# Patient Record
Sex: Male | Born: 1965 | Race: White | Hispanic: No | Marital: Married | Smoking: Never smoker
Health system: Southern US, Community
[De-identification: ages and names within clinical notes are randomized; demographics above are authoritative.]

## PROBLEM LIST (undated history)

## (undated) DIAGNOSIS — G4730 Sleep apnea, unspecified: Secondary | ICD-10-CM

## (undated) DIAGNOSIS — K219 Gastro-esophageal reflux disease without esophagitis: Secondary | ICD-10-CM

## (undated) DIAGNOSIS — N200 Calculus of kidney: Secondary | ICD-10-CM

## (undated) DIAGNOSIS — I10 Essential (primary) hypertension: Secondary | ICD-10-CM

## (undated) DIAGNOSIS — H269 Unspecified cataract: Secondary | ICD-10-CM

## (undated) DIAGNOSIS — I4510 Unspecified right bundle-branch block: Secondary | ICD-10-CM

## (undated) DIAGNOSIS — L905 Scar conditions and fibrosis of skin: Secondary | ICD-10-CM

## (undated) DIAGNOSIS — Z87442 Personal history of urinary calculi: Secondary | ICD-10-CM

## (undated) HISTORY — DX: Unspecified cataract: H26.9

## (undated) HISTORY — DX: Scar conditions and fibrosis of skin: L90.5

## (undated) HISTORY — DX: Unspecified right bundle-branch block: I45.10

## (undated) HISTORY — PX: ELBOW SURGERY: SHX618

## (undated) HISTORY — DX: Sleep apnea, unspecified: G47.30

## (undated) HISTORY — DX: Gastro-esophageal reflux disease without esophagitis: K21.9

## (undated) HISTORY — PX: OTHER SURGICAL HISTORY: SHX169

## (undated) HISTORY — DX: Essential (primary) hypertension: I10

---

## 2001-02-16 ENCOUNTER — Ambulatory Visit (HOSPITAL_BASED_OUTPATIENT_CLINIC_OR_DEPARTMENT_OTHER): Admission: RE | Admit: 2001-02-16 | Discharge: 2001-02-17 | Payer: Self-pay | Admitting: Orthopedic Surgery

## 2001-06-28 ENCOUNTER — Encounter: Payer: Self-pay | Admitting: Emergency Medicine

## 2001-06-28 ENCOUNTER — Inpatient Hospital Stay (HOSPITAL_COMMUNITY): Admission: EM | Admit: 2001-06-28 | Discharge: 2001-06-29 | Payer: Self-pay | Admitting: Internal Medicine

## 2001-06-28 ENCOUNTER — Encounter: Payer: Self-pay | Admitting: Internal Medicine

## 2001-06-29 ENCOUNTER — Encounter: Payer: Self-pay | Admitting: Internal Medicine

## 2001-08-15 ENCOUNTER — Encounter: Payer: Self-pay | Admitting: *Deleted

## 2001-08-15 ENCOUNTER — Inpatient Hospital Stay (HOSPITAL_COMMUNITY): Admission: EM | Admit: 2001-08-15 | Discharge: 2001-08-16 | Payer: Self-pay | Admitting: Emergency Medicine

## 2003-09-12 ENCOUNTER — Encounter: Payer: Self-pay | Admitting: Internal Medicine

## 2003-10-10 ENCOUNTER — Encounter: Payer: Self-pay | Admitting: Internal Medicine

## 2006-12-16 ENCOUNTER — Encounter: Admission: RE | Admit: 2006-12-16 | Discharge: 2006-12-16 | Payer: Self-pay | Admitting: Emergency Medicine

## 2007-08-28 ENCOUNTER — Encounter: Admission: RE | Admit: 2007-08-28 | Discharge: 2007-08-28 | Payer: Self-pay | Admitting: Emergency Medicine

## 2007-08-28 ENCOUNTER — Encounter (INDEPENDENT_AMBULATORY_CARE_PROVIDER_SITE_OTHER): Payer: Self-pay | Admitting: *Deleted

## 2009-08-13 ENCOUNTER — Ambulatory Visit: Payer: Self-pay | Admitting: Family

## 2009-08-13 ENCOUNTER — Encounter: Payer: Self-pay | Admitting: Cardiovascular Disease

## 2009-08-13 ENCOUNTER — Telehealth: Payer: Self-pay | Admitting: Family

## 2009-08-13 DIAGNOSIS — I10 Essential (primary) hypertension: Secondary | ICD-10-CM

## 2009-08-13 DIAGNOSIS — I4510 Unspecified right bundle-branch block: Secondary | ICD-10-CM | POA: Insufficient documentation

## 2009-08-13 DIAGNOSIS — K219 Gastro-esophageal reflux disease without esophagitis: Secondary | ICD-10-CM

## 2009-08-13 DIAGNOSIS — J309 Allergic rhinitis, unspecified: Secondary | ICD-10-CM | POA: Insufficient documentation

## 2009-08-13 DIAGNOSIS — J302 Other seasonal allergic rhinitis: Secondary | ICD-10-CM | POA: Insufficient documentation

## 2009-08-13 HISTORY — DX: Essential (primary) hypertension: I10

## 2009-08-13 LAB — CONVERTED CEMR LAB
ALT: 33 units/L (ref 0–53)
AST: 23 units/L (ref 0–37)
Alkaline Phosphatase: 92 units/L (ref 39–117)
Bilirubin, Direct: 0.1 mg/dL (ref 0.0–0.3)
CO2: 24 meq/L (ref 19–32)
Chloride: 101 meq/L (ref 96–112)
Indirect Bilirubin: 0.3 mg/dL (ref 0.0–0.9)
Lipase: 29 units/L (ref 0–75)
Lymphocytes Relative: 19 % (ref 12–46)
Lymphs Abs: 3 10*3/uL (ref 0.7–4.0)
Monocytes Relative: 10 % (ref 3–12)
Neutrophils Relative %: 71 % (ref 43–77)
Platelets: 308 10*3/uL (ref 150–400)
Potassium: 4.4 meq/L (ref 3.5–5.3)
RBC: 5.32 M/uL (ref 4.22–5.81)
Sodium: 141 meq/L (ref 135–145)
WBC: 16.2 10*3/uL — ABNORMAL HIGH (ref 4.0–10.5)

## 2009-08-14 ENCOUNTER — Encounter (INDEPENDENT_AMBULATORY_CARE_PROVIDER_SITE_OTHER): Payer: Self-pay | Admitting: *Deleted

## 2009-08-18 ENCOUNTER — Encounter (INDEPENDENT_AMBULATORY_CARE_PROVIDER_SITE_OTHER): Payer: Self-pay | Admitting: *Deleted

## 2009-08-25 ENCOUNTER — Telehealth: Payer: Self-pay | Admitting: Internal Medicine

## 2009-09-01 ENCOUNTER — Ambulatory Visit: Payer: Self-pay | Admitting: Family

## 2009-09-01 LAB — CONVERTED CEMR LAB
HDL: 64 mg/dL (ref >39)
LDL Cholesterol: 146 mg/dL — ABNORMAL HIGH (ref 0–99)
Total CHOL/HDL Ratio: 3.7
VLDL: 26 mg/dL (ref 0–40)

## 2009-09-02 ENCOUNTER — Telehealth (INDEPENDENT_AMBULATORY_CARE_PROVIDER_SITE_OTHER): Payer: Self-pay | Admitting: *Deleted

## 2009-09-02 DIAGNOSIS — E785 Hyperlipidemia, unspecified: Secondary | ICD-10-CM | POA: Insufficient documentation

## 2009-09-04 ENCOUNTER — Ambulatory Visit: Payer: Self-pay

## 2009-09-04 ENCOUNTER — Ambulatory Visit (HOSPITAL_COMMUNITY): Admission: RE | Admit: 2009-09-04 | Discharge: 2009-09-04 | Payer: Self-pay | Admitting: Family

## 2009-09-04 ENCOUNTER — Ambulatory Visit: Payer: Self-pay | Admitting: Cardiology

## 2009-09-04 ENCOUNTER — Encounter: Payer: Self-pay | Admitting: Cardiology

## 2009-09-09 DIAGNOSIS — F411 Generalized anxiety disorder: Secondary | ICD-10-CM | POA: Insufficient documentation

## 2009-09-09 DIAGNOSIS — K449 Diaphragmatic hernia without obstruction or gangrene: Secondary | ICD-10-CM | POA: Insufficient documentation

## 2009-09-15 ENCOUNTER — Ambulatory Visit: Payer: Self-pay | Admitting: Internal Medicine

## 2009-09-18 ENCOUNTER — Ambulatory Visit: Payer: Self-pay | Admitting: Internal Medicine

## 2009-09-22 ENCOUNTER — Encounter: Payer: Self-pay | Admitting: Internal Medicine

## 2009-10-10 ENCOUNTER — Encounter: Payer: Self-pay | Admitting: Internal Medicine

## 2009-11-24 ENCOUNTER — Ambulatory Visit: Payer: Self-pay | Admitting: Family

## 2009-11-24 LAB — CONVERTED CEMR LAB
Cholesterol: 176 mg/dL (ref 0–200)
Total CHOL/HDL Ratio: 4

## 2009-12-01 ENCOUNTER — Ambulatory Visit: Payer: Self-pay | Admitting: Family

## 2009-12-01 DIAGNOSIS — K222 Esophageal obstruction: Secondary | ICD-10-CM

## 2010-04-20 ENCOUNTER — Telehealth (INDEPENDENT_AMBULATORY_CARE_PROVIDER_SITE_OTHER): Payer: Self-pay | Admitting: *Deleted

## 2010-04-22 ENCOUNTER — Ambulatory Visit: Payer: Self-pay | Admitting: Internal Medicine

## 2010-04-22 DIAGNOSIS — M2550 Pain in unspecified joint: Secondary | ICD-10-CM

## 2010-04-22 LAB — CONVERTED CEMR LAB
Basophils Relative: 0.3 % (ref 0.0–3.0)
Eosinophils Absolute: 0 10*3/uL (ref 0.0–0.7)
HCT: 44 % (ref 39.0–52.0)
Hemoglobin: 15.3 g/dL (ref 13.0–17.0)
Lymphocytes Relative: 31.5 % (ref 12.0–46.0)
MCHC: 34.8 g/dL (ref 30.0–36.0)
Monocytes Relative: 14.7 % — ABNORMAL HIGH (ref 3.0–12.0)
Neutro Abs: 4.5 10*3/uL (ref 1.4–7.7)
RBC: 4.93 M/uL (ref 4.22–5.81)

## 2010-04-23 LAB — CONVERTED CEMR LAB
Anti Nuclear Antibody(ANA): NEGATIVE
Rhuematoid fact SerPl-aCnc: <20 intl units/mL (ref 0–20)

## 2010-07-24 ENCOUNTER — Telehealth (INDEPENDENT_AMBULATORY_CARE_PROVIDER_SITE_OTHER): Payer: Self-pay | Admitting: *Deleted

## 2010-08-26 ENCOUNTER — Ambulatory Visit
Admission: RE | Admit: 2010-08-26 | Discharge: 2010-08-26 | Payer: Self-pay | Source: Home / Self Care | Attending: Internal Medicine | Admitting: Internal Medicine

## 2010-08-26 DIAGNOSIS — F528 Other sexual dysfunction not due to a substance or known physiological condition: Secondary | ICD-10-CM | POA: Insufficient documentation

## 2010-08-29 ENCOUNTER — Encounter: Payer: Self-pay | Admitting: Family Medicine

## 2010-08-30 ENCOUNTER — Encounter: Payer: Self-pay | Admitting: Internal Medicine

## 2010-09-08 NOTE — Letter (Signed)
Summary: Ultrasound No Show Letter  [facility]  [address] [place], [place] [address]   Phone: [phone]  Fax: [phone]              10/10/2009 MRN: [id number]  Danny Henderson [address] [place], [place]  [address]   Dear Mr. Funke,   Our records indicate that you missed your scheduled appointment at [facility] for your abdominal ultrasound on 09/23/09. Please contact this office to reschedule your appointment as soon as possible.  It is important that you keep your scheduled appointments so we can provide you the best care possible.  Sincerely,     Hortense Ramal, [facility]

## 2010-09-08 NOTE — Assessment & Plan Note (Signed)
Summary: new to est//kn   Vital Signs:  Patient profile:   45 year old male Weight:      208.25 pounds Pulse rate:   85 / minute Pulse rhythm:   regular BP sitting:   126 / 80  (left arm) Cuff size:   regular  Vitals Entered By: Army Fossa CMA (April 22, 2010 12:44 PM) CC: New to establish- not fasting Comments C/o pain in his knees, feet, hips. Arthritis?    History of Present Illness: complaining of symmetric joint pains Ankle = feet > Knees > hips symptoms started about a year ago, one off. He walks a lot 8 hours a day at  work  ROS No joint swelling, puffiness No pain at the wrist or hands Morning stiffness lasts less than 10 minutes and gets better as soon as he starts walking. No fever or weight loss No facial rash   Current Medications (verified): 1)  Nexium 40 Mg Cpdr (Esomeprazole Magnesium) .... Take 1 Capsule Daily  Allergies (verified): No Known Drug Allergies  Past History:  Past Medical History: Allergic rhinitis GERD       Past Surgical History: Reviewed history from 09/09/2009 and no changes required. Right Elbow Surgery  Family History: DM-- F high chol--F  CABG x 5-  late 65's-- F  prostate ca--no Colon Cancer--no  Social History: Occupation: Self Employed, Social research officer, government, car selling  Married   1 son 17  Alcohol Use - yes Illicit Drug Use - no Patient has never smoked.  Daily Caffeine Use 3 per day  Physical Exam  General:  alert, well-developed, and well-nourished.   Neck:  no thyromegaly Lungs:  normal respiratory effort, no intercostal retractions, no accessory muscle use, and normal breath sounds.   Heart:  Normal rate and regular rhythm. S1 and S2 normal without gallop, murmur, click, rub or other extra sounds. Extremities:  no lower extremity edema Inspection and palpation of hands, wrists, knees, feet and ankles normal   Impression & Recommendations:  Problem # 1:  PAIN IN JOINT, MULTIPLE SITES  (ICD-719.49)  probably early DJD Plan: Labs Meloxicam, warned about GI side effects Stretching twice a day  Orders: Venipuncture (40102) TLB-CBC Platelet - w/Differential (85025-CBCD) TLB-TSH (Thyroid Stimulating Hormone) (84443-TSH) TLB-Sedimentation Rate (ESR) (85652-ESR) T-Antinuclear Antib (ANA) (72536-64403) Specimen Handling (47425)  Complete Medication List: 1)  Nexium 40 Mg Cpdr (Esomeprazole magnesium) .... Take 1 capsule daily 2)  Meloxicam 7.5 Mg Tabs (Meloxicam) .Marland Kitchen.. 1 or 2 by mouth once daily with food as needed pain  Patient Instructions: 1)  try meloxicam 2)  watch for stomach side effects  Prescriptions: MELOXICAM 7.5 MG TABS (MELOXICAM) 1 or 2 by mouth once daily with food as needed pain  #45 x 1   Entered and Authorized by:   Elita Quick E. Paz MD   Signed by:   Nolon Rod. Paz MD on 04/22/2010   Method used:   Electronically to        [facility] [phone]* (retail)       [address]       [place], [place]  [address]       Ph: [phone]       Fax: [phone]   RxID:   [id number]    Immunization History:  Tetanus/Td Immunization History:    Tetanus/Td:  historical (08/10/2003)

## 2010-09-08 NOTE — Letter (Signed)
Summary: Primary Care Consult Scheduled Letter  [place] [facility]  [address]   [place], [place] [address]   Phone: [phone]  Fax: [phone]      08/18/2009 MRN: [id number]  Danny Henderson [address] [place], [place]  [address]    Dear [name],      We have scheduled an appointment for you.  At the recommendation of Sandford Craze NP, we have scheduled you for a 2-D Echo @  [facility] on August 26, 2009 at 10:30am .  Their address [address] [place] , [place] . The office phone number is [phone].  If this appointment day and time is not convenient for you, please feel free to call the office of the doctor you are being referred to at the number listed above and reschedule the appointment.     It is important for you to keep your scheduled appointments. We are here to make sure you are given good patient care. If you have questions or you have made changes to your appointment, please notify us at  [phone], ask for [facility].    Thank you,  Patient Care Coordinator  at [facility]

## 2010-09-08 NOTE — Assessment & Plan Note (Signed)
Summary: ABD. PAIN                  DEBORAH   History of Present Illness Visit Type: Initial Consult Primary GI MD: Lina Sar MD Primary Provider: Sandford Craze, MD Requesting Provider: Sandford Craze, MD Chief Complaint: Patient complains of upper abdominal that is constant but gets worse after spicy foods. The pain started a couple of months ago. History of Present Illness:   This is a 45 year old white male with epigastric and subxiphoid pain occurring during the day but not at night. The pain is localized anteriorly slightly to the right of the midline. There is no dysphagia. An upper endoscopy done in March 2005 showed mild gastritis which was H Pylori negative. He gives a history of H. pylori positive gastritis which was treated with Prevpac. He drinks alcohol about 3 times a week, 2 drinks at a time. His lipase was elevated in 2005. There is a family history of gallbladder disease in both  mother and father. An upper abdominal ultrasound in 2009 showed fatty liver but a normal-appearing gallbladder. He has been on Nexium 40 mg a day without much improvement of the symptoms. The pain is brought on by bending over and physical activity. It is not brought on by foods. He has lots of stress at work. 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   GI Review of Systems    Reports abdominal pain.     Location of  Abdominal pain: upper abdomen.    Denies acid reflux, belching, bloating, chest pain, dysphagia with liquids, dysphagia with solids, heartburn, loss of appetite, nausea, vomiting, vomiting blood, weight loss, and  weight gain.        Denies anal fissure, black tarry stools, change in bowel habit, constipation, diarrhea, diverticulosis, fecal incontinence, heme positive stool, hemorrhoids, irritable bowel syndrome, jaundice, light color stool, liver problems, rectal bleeding, and  rectal pain. Preventive Screening-Counseling & Management  Alcohol-Tobacco     Smoking Status: never      Drug Use:  no.      Current Medications (verified): 1)  Nexium 40 Mg Cpdr (Esomeprazole Magnesium) .... Take 1 Capsule By Mouth Once A Day  Allergies (verified): No Known Drug Allergies  Past History:  Past Medical History: Reviewed history from 09/09/2009 and no changes required. Allergic rhinitis GERD  HIATAL HERNIA (ICD-553.3) GASTRITIS, HX OF (ICD-V12.79) ANXIETY (ICD-300.00) HYPERLIPIDEMIA (ICD-272.4) RIGHT BUNDLE BRANCH BLOCK (ICD-426.4) PREVENTIVE HEALTH CARE (ICD-V70.0) ELEVATED BLOOD PRESSURE WITHOUT DIAGNOSIS OF HYPERTENSION (ICD-796.2) EPIGASTRIC PAIN (ICD-789.06) GERD (ICD-530.81) ALLERGIC RHINITIS (ICD-477.9) FAMILY HISTORY DIABETES 1ST DEGREE RELATIVE (ICD-V18.0) FAMILY HISTORY OF CAD MALE 1ST DEGREE RELATIVE <50 (ICD-V17.3)    Past Surgical History: Reviewed history from 09/09/2009 and no changes required. Right Elbow Surgery  Family History: Family History of Arthritis Family History of CAD Male 1st degree relative <50: Father Family History Diabetes 1st degree relative: father Family History High cholesterol Family History Hypertension Mom- healthy Dad- CABG x 5-  late 35's son- alive and well only child No FH of Colon Cancer:  Social History: Occupation: Self Employed Married 24 years 1 son 30  Alcohol Use - yes Illicit Drug Use - no Patient has never smoked.  Daily Caffeine Use 3 per day  Review of Systems  The patient denies allergy/sinus, anemia, anxiety-new, arthritis/joint pain, back pain, blood in urine, breast changes/lumps, change in vision, confusion, cough, coughing up blood, depression-new, fainting, fatigue, fever, headaches-new, hearing problems, heart murmur, heart rhythm changes, itching, menstrual pain, muscle pains/cramps, night  sweats, nosebleeds, pregnancy symptoms, shortness of breath, skin rash, sleeping problems, sore throat, swelling of feet/legs, swollen lymph glands, thirst - excessive , urination - excessive ,  urination changes/pain, urine leakage, vision changes, and voice change.         Pertinent positive and negative review of systems were noted in the above HPI. All other ROS was otherwise negative.   Vital Signs:  Patient profile:   45 year old male Height:      72 inches Weight:      210.2 pounds BMI:     28.61 Pulse rate:   120 / minute Pulse rhythm:   regular BP sitting:   140 / 88  (left arm) Cuff size:   regular  Vitals Entered By: Harlow Mares CMA Duncan Dull) (September 15, 2009 2:19 PM)  Physical Exam  General:  Well developed, well nourished, no acute distress. Eyes:  PERRLA, no icterus. Mouth:  No deformity or lesions, dentition normal. Neck:  Supple; no masses or thyromegaly. Lungs:  Clear throughout to auscultation. Heart:  Regular rate and rhythm; no murmurs, rubs,  or bruits. Abdomen:  nontender abdomen. Unable to reproduce patient's symptoms. No pain along the left or right costal margin. Liver edge at costal margin. No CVA tenderness. Lower abdomen unremarkable. Normoactive bowel sounds. Extremities:  No clubbing, cyanosis, edema or deformities noted. Neurologic:  Alert and oriented x4;  grossly normal neurologically. Skin:  Intact without significant lesions or rashes. Psych:  Alert and cooperative. Normal mood and affect.   Impression & Recommendations:  Problem # 1:  HIATAL HERNIA (ICD-553.3)  Patient has epigastric pain in the area of the diaphragmatic hiatus suggestive of gastritis or possibly gastroesophageal reflux. He had a hiatal hernia demonstrated on an upper GI series in 2009. He has a positive family history of gallbladder disease but had a normal ultrasound in 2009. We will plan a repeat upper endoscopy with biopsies. He is to increase his Nexium to 40 mg p.o. b.i.d. x 2 weeks then decrease back down to 1 capsule daily. We will also repeat an upper abdominal ultrasound. I have asked the patient to reduce his alcohol intake. He is not a  smoker.  Orders: EGD (EGD) Ultrasound Abdomen (UAS)  Problem # 2:  ANXIETY (ICD-300.00) Patient has been under a lot of stress at work.  Patient Instructions: 1)  antireflux measures. 2)  Nexium 40 mg p.o. b.i.d. samples given. 3)  Upper endoscopy. 4)  Upper abdominal ultrasound. 5)  Avoid strenuous exercise or anything that can bring on the pain. 6)  Copy sent to : Sandford Craze 7)  The medication list was reviewed and reconciled.  All changed / newly prescribed medications were explained.  A complete medication list was provided to the patient / caregiver. Prescriptions: NEXIUM 40 MG CPDR (ESOMEPRAZOLE MAGNESIUM) Take 1 capsule by mouth two times a day x 2 weeks, then decrease to 1 capsule daily  #60 x 1   Entered by:   Hortense Ramal CMA (AAMA)   Authorized by:   Hart Carwin MD   Signed by:   Hortense Ramal CMA (AAMA) on 09/15/2009   Method used:   Electronically to        [facility] [phone]* (retail)       [address]       [place], [place]  [address]       Ph: [phone]       Fax: [phone]   RxID:   [id number]  Appended Document: ABD. PAIN                  DEBORAH Patient no showed his abdominal ultrasound. He has been called on multiple occasions with no return phone call. I have sent him a letter to call back and reschedule his ultrasound. Dr Juanda Chance has been advised.

## 2010-09-08 NOTE — Assessment & Plan Note (Signed)
Summary: Gastroenterology  [place]   MR#:  [id number] Page #  NAME:  Danny Henderson, Danny Henderson    OFFICE NO:  [id number]  DATE:  09/12/03  DOB:  09/14/2065  CHIEF COMPLAINT:  The patient is a very nice 45 year old gentleman who comes for evaluation of intermittent abdominal pain.  He describes epigastric and above the umbilicus pain, which occurs during the day mostly postprandially.  It occurs after lunch mostly and lasts several hours until the next meal when he is ready to eat again.  This pain started about two months ago.  Since about three months ago he has been under a lot of stress, building a new house.  He also has his own new business, a Software engineer, and he has been very busy with that.  He has never had any gastrointestinal problems in the past.  He does not remember having abdominal pain, food intolerance or any bowel problems.  He does not drink alcohol other than socially.  He has a lot of gas and bloating but no vomiting.  He denies taking any nonsteroidal anti-inflammatory drugs or aspirin.  On occasion he may take aspirin but not on a regular basis.    PAST MEDICAL HISTORY:  Significant for high blood pressure, anxiety.  He was evaluated for noncardiac chest pain in February 2003 when his cardiac workup by the cardiologist was negative and was attributed to anxiety.  FAMILY HISTORY:  Positive for heart disease in his father.  SOCIAL HISTORY:  He is self-employed.  He has children.  He does not smoke.  Drinks socially.  REVIEW OF SYSTEMS:  Positive for a history of chest pains and shortness of breath when he gets abdominal pain.    PHYSICAL EXAMINATION:  VITAL SIGNS:  Blood pressure 130/80.  Pulse 80.  Weight 194 pounds.  GENERAL:  He was very outgoing and cooperative, somewhat anxious.  SKIN:  Warm and dry.  The sclerae were not icteric.  There was no palmar erythema.  LUNGS:  Clear to auscultation.  COR:  With normal S1, normal S2 with rapid rhythm of about 85 per minute.  ABDOMEN:  Soft and  nontender with normoactive bowel sounds.  I could not elicit any tenderness in his abdomen although he said yesterday there was a lot of pain in the epigastrium after his meal.  It is completely resolved.  There was no costovertebral angle tenderness.  RECTAL:  Rectal exam shows normal rectal tone with soft hemoccult negative stool.  LABORATORY:  Laboratory data from Dr. Myrtie Cruise office shows mild elevation of lipase to 47 with normal amylase of 67, normal CBC on August 15, 2003, and normal liver function tests.  He had positive H. pylori antibody and was treated with Prevpac for two weeks.  IMPRESSION:  45 year old white male with episodic upper gastrointestinal dyspepsia and abdominal pain postprandially, which may indicate gastritis, Helicobacter pylori gastropathy or possibly functional gastrointestinal problems because of the association of his symptoms with stress.  His lipase was slightly elevated but his symptoms are not suggestive of pancreatitis.  His symptoms are not suggestive of biliary disease.  We could postulate also the possibility of aspirin-induced gastropathy.    He has a history of noncardiac chest pain attributed to anxiety.  I believe his chest and abdominal symptoms may be related to functional problems.  PLAN:   1.   Upper endoscopy on October 08, 2003, in our office at 4 p.m.  We will plan to do biopsies. 2.   Continue on Zegerid  20 mg p.o. b.i.d.  I have given him more samples. 3.   Xanax 0.25 mg to take on a p.r.n. basis for abdominal pain.  I believe he needs to improve his eating habits to eat less and at a slower pace, take time for lunch and avoid high fat, heavy foods.  The Xanax will be used only on a p.r.n. basis for stressful situations since his symptoms are associated with stress.    Hedwig Morton. Juanda Chance, M.D.  [id number] cc:  Dr. Ruthine Dose D:  09/12/03; T:  ; Job [phone]

## 2010-09-08 NOTE — Procedures (Signed)
Summary: ENDOSCOPY    Patient Name: Danny Henderson, Danny Henderson MRN:  Procedure Procedures: Panendoscopy (EGD) CPT: 43235.    with biopsy(s)/brushing(s). CPT: D1846139.  Personnel: Endoscopist: Dora L. Juanda Chance, MD.  Referred By: Angelena Sole, MD.  Exam Location: Exam performed in Outpatient Clinic. Outpatient  Patient Consent: Procedure, Alternatives, Risks and Benefits discussed, consent obtained, from patient. Consent was obtained by the RN.  Indications Symptoms: Abdominal pain, location: epigastric.  History  Current Medications: Patient is not currently taking Coumadin.  Pre-Exam Physical: Performed Oct 10, 2003  Cardio-pulmonary exam, HEENT exam, Abdominal exam, Extremity exam, Neurological exam, Mental status exam WNL.  Exam Exam Info: Maximum depth of insertion Duodenum, intended Duodenum. Vocal cords visualized. Gastric retroflexion performed. Images taken. ASA Classification: I. Tolerance: good.  Sedation Meds: Patient assessed and found to be appropriate for moderate (conscious) sedation. Fentanyl 50 mcg. given IV. Versed 10 mg. given IV. Cetacaine Spray 2 sprays given aerosolized.  Monitoring: BP and pulse monitoring done. Oximetry used. Supplemental O2 given  Findings - Normal: Cardia to Fundus.  - DIAGNOSTIC TEST: Biopsy taken. from Distal Esophagus.  - MUCOSAL ABNORMALITY: Body to Antrum. Erythematous mucosa. RUT done, results pending. ICD9: Gastritis, Acute: 535.00. Comment: mild antral gastritis.   Assessment Abnormal examination, see findings above.  Diagnoses: 535.00: Gastritis, Acute.   Comments: minimal gastritis, s/p esophageal bipsies Events  Unplanned Intervention: No unplanned interventions were required.  Unplanned Events: There were no complications. Plans Medication(s): Await pathology. PPI: 1 QD, starting Oct 10, 2003   Disposition: After procedure patient sent to recovery. After recovery patient sent home.    CC: Ann M.Kulik,MD  This  report was created from the original endoscopy report, which was reviewed and signed by the above listed endoscopist.

## 2010-09-08 NOTE — Letter (Signed)
Summary: Primary Care Consult Scheduled Letter  [place] at [facility]  [address]   [place], [place] [address]   Phone: [phone]  Fax: [phone]      08/14/2009 MRN: [id number]  Danny Henderson [address] [place], [place]  [address]    Dear Mr. Sotto,      We have scheduled an appointment for you.  At the recommendation of Sandford Craze NP, we have scheduled you a consult with [facility] , Dr Juanda Chance  on Ivin Poot 1,2011 at 10:30am.  Their address [address], [place]. The office phone number is [phone].  If this appointment day and time is not convenient for you, please feel free to call the office of the doctor you are being referred to at the number listed above and reschedule the appointment.     It is important for you to keep your scheduled appointments. We are here to make sure you are given good patient care. If you have questions or you have made changes to your appointment, please notify us at  [phone], ask for [facility].    Thank you,  Patient Care Coordinator [place] at [facility]

## 2010-09-08 NOTE — Progress Notes (Signed)
Summary: Folllow up  Phone Note Outgoing Call   Summary of Call: Please call Mr. Cunnington and ask him to return to complete a chest x-ray.  His white blood cell count is up and since he is also having a cough I would like to rule out pneumonia.  Patient should keep upcoming appointment and call if fever, worsening cough or sinus congestion. Thanks Initial call taken by: Lemont Fillers FNP,  August 13, 2009 10:19 PM  Follow-up for Phone Call        attempted to contact patient at [phone], no answer, voice message left to return call Follow-up by: Glendell Docker CMA,  August 14, 2009 1:58 PM  Additional Follow-up for Phone Call Additional follow up Details #1::        attempted to contact patient at [phone], no answer voice message left advsing patient per [facility] instructions Additional Follow-up by: Glendell Docker CMA,  August 15, 2009 12:58 PM

## 2010-09-08 NOTE — Letter (Signed)
Summary: Patient [facility] Biopsy Results  [facility]  [address] [place], [place] [address]   Phone: [phone]  Fax: [phone]        September 22, 2009 MRN: [id number]    Lawrence Smelser [address] [place], [place]  [address]    Dear Mr. Bakken,  I am pleased to inform you that the biopsies taken during your recent endoscopic examination did not show any evidence of cancer upon pathologic examination.The biopsies show mild inflammation due to reflux  Additional information/recommendations:  __No further action is needed at this time.  Please follow-up with      your primary care physician for your other healthcare needs.  __ Please call [phone] to schedule a return visit to review      your condition.  _x_ Continue with the treatment plan as outlined on the day of your      exam.  _   Please call us if you are having persistent problems or have questions about your condition that have not been fully answered at this time.  Sincerely,  Hart Carwin MD  This letter has been electronically signed by your physician.  Appended Document: Patient Notice-Endo Biopsy Results Letter mailed 2.16.11

## 2010-09-08 NOTE — Assessment & Plan Note (Signed)
Summary: NEW TO EST, TO BE TABORI PT, RUQ ABD PAIN, UHC/RH......   Vital Signs:  Patient profile:   45 year old male Height:      72 inches Weight:      206.50 pounds BMI:     28.11 O2 Sat:      98 % on Room air Temp:     97.7 degrees F oral Pulse rate:   120 / minute Pulse rhythm:   regular Resp:     18 per minute BP sitting:   150 / 110  (left arm) Cuff size:   large  Vitals Entered By: Glendell Docker CMA (August 13, 2009 1:52 PM)  O2 Flow:  Room air  CC:  New Patient .  History of Present Illness: New Patient  c/o stomach irritation and pain for the past couple of months-eating makes the pain worse.   Patient notes that he has epigastric pain, + history of hiatal hernia 1 1/2 years ago.  This diagnosed by barium swallow- ordered by previous physician Dr Lorenz Coaster.  Pain is worse after eating.  Patient takes nexium daily.    Preventative- Last tetanus shot less than 10 years ago. Patient works at a [employer]video [employer]- walks a lot at work.  Reports reasonably healthy diet.  Preventive Screening-Counseling & Management  Alcohol-Tobacco     Alcohol drinks/day: 3 per week     Alcohol type: all     Alcohol Counseling: to decrease amount and/or frequency of alcohol intake     Smoking Status: never     Tobacco Counseling: not indicated; no tobacco use  Caffeine-Diet-Exercise     Caffeine use/day: 2-3 cups coffee daily     Caffeine Counseling: decrease use of caffeine     Does Patient Exercise: no  Allergies (verified): No Known Drug Allergies  Past History:  Past Medical History: Allergic rhinitis GERD  Family History: Family History of Arthritis Family History of CAD Male 1st degree relative <50 Family History Diabetes 1st degree relative Family History High cholesterol Family History Hypertension  Mom- healthy Dad- CABG x 5-  late 65's son- alive and well only child  Social History: Occupation: Self Employed Married 24 years 1 son 1 Smoking Status:  never Does  Patient Exercise:  no Caffeine use/day:  2-3 cups coffee daily  Review of Systems       Constitutional: Denies Fever ENT:  + nasal congestion treated with antibiotics symptoms improved +cough- non-productive- still completing antibiotics Resp: + cough CV:  Denies Chest Pain GI:  Denies nausea or vomitting GU: Denies dysuria Lymphatic: Denies lymphadenopathy Musculoskeletal:  gets back "cramping" Skin:  Denies Rashes Psychiatric: Denies depression Neuro: Denies numbness     Physical Exam  General:  Well-developed,well-nourished,in no acute distress; alert,appropriate and cooperative throughout examination Head:  Normocephalic and atraumatic without obvious abnormalities. No apparent alopecia or balding. Eyes:  PERRLA Mouth:  Oral mucosa and oropharynx without lesions or exudates.  Teeth in good repair. Neck:  No deformities, masses, or tenderness noted. Lungs:  Normal respiratory effort, chest expands symmetrically. Lungs are clear to auscultation, no crackles or wheezes. Heart:  Normal rate and regular rhythm. S1 and S2 normal without gallop, murmur, click, rub or other extra sounds. Abdomen:  Bowel sounds positive,abdomen soft and non-tender without masses, organomegaly or hernias noted. Msk:  No deformity or scoliosis noted of thoracic or lumbar spine.   Extremities:  No clubbing, cyanosis, edema, or deformity noted with normal full range of motion of all joints.  Neurologic:  alert & oriented X3 and gait normal.   Cervical Nodes:  No lymphadenopathy noted Psych:  Cognition and judgment appear intact. Alert and cooperative with normal attention span and concentration. No apparent delusions, illusions, hallucinations   Impression & Recommendations:  Problem # 1:  Preventive Health Care (ICD-V70.0) Assessment New Patient up to date on TD, received flu shot today,  also checked EKG given strong family hx of CAD.  Patient counselled on diet and exercise. Orders: TLB-BMP  (Basic Metabolic Panel-BMET) (80048-METABOL) TLB-CBC Platelet - w/Differential (85025-CBCD)  Problem # 2:  EPIGASTRIC PAIN (ICD-789.06) Assessment: New Incidental note of leukocytosis on today's labs.  (see phone note)  Otherwise labs unremarkable.  Will refer to GI, will likely need EGD, continue PPI Orders: TLB-Hepatic/Liver Function Pnl (80076-HEPATIC) TLB-Amylase (82150-AMYL) TLB-Lipase (83690-LIPASE) EKG w/ Interpretation (93000) Gastroenterology Referral (GI)  Problem # 3:  ELEVATED BLOOD PRESSURE WITHOUT DIAGNOSIS OF HYPERTENSION (ICD-796.2) Assessment: New Patient started taking Claritin D- recommended patient switch to plain Claritin and return in 2 weeks for follow up.  Counseled on a low sodium diet  Problem # 4:  RIGHT BUNDLE BRANCH BLOCK (ICD-426.4) Assessment: New  reviewed EKG with Dr. Doylene Bode [facility] cardiology) No old EKG available for comparison- he recommends a 2-D echo.  Orders: Echo Referral (Echo)  Complete Medication List: 1)  Nexium 40 Mg Cpdr (Esomeprazole magnesium) .... Take 1 capsule by mouth once a day  Other Orders: Influenza Vaccine NON MCR ([address]) Admin 1st Vaccine (60454)  Patient Instructions: 1)  Please schedule a follow-up appointment in 2 weeks. 2)  Limit your Sodium (Salt). 3)  Please return fasting for a Fasting Lipid Profile (v70)  Current Allergies (reviewed today): No known allergies      Immunizations Administered:  Influenza Vaccine # 1:    Vaccine Type: Fluvax Non-MCR    Site: left deltoid    Mfr: GlaxoSmithKline    Dose: 0.5 ml    Route: IM    Given by: Glendell Docker CMA    Exp. Date: 02/05/2010    Lot #: [id number]    VIS given: 03/18/2009  Flu Vaccine Consent Questions:    Do you have a history of severe allergic reactions to this vaccine? no    Any prior history of allergic reactions to egg and/or gelatin? no    Do you have a sensitivity to the preservative Thimersol? no    Do you have a past  history of Guillan-Barre Syndrome? no    Do you currently have an acute febrile illness? no    Have you ever had a severe reaction to latex? no    Vaccine information given and explained to patient? yes

## 2010-09-08 NOTE — Letter (Signed)
Summary: EGD Instructions  [facility]  [address] [place], [place] [address]   Phone: [phone]  Fax: [phone]       Danny Henderson    [birth year]    MRN: [id number]       Procedure Day /Date: 09/18/09 (Thursday)     Arrival Time: 1:00 pm     Procedure Time: 2:00 pm     Location of Procedure:                    _ x _ [facility] (4th Floor)  PREPARATION FOR ENDOSCOPY   On 09/18/09 THE DAY OF THE PROCEDURE:  1.   No solid foods, milk or milk products are allowed after midnight the night before your procedure.  2.   Do not drink anything colored red or purple.  Avoid juices with pulp.  No orange juice.  3.  You may drink clear liquids until 12:00 pm, which is 2 hours before your procedure.                                                                                                CLEAR LIQUIDS INCLUDE: Water Jello Ice Popsicles Tea (sugar ok, no milk/cream) Powdered fruit flavored drinks Coffee (sugar ok, no milk/cream) Gatorade Juice: apple, white grape, white cranberry  Lemonade Clear bullion, consomm, broth Carbonated beverages (any kind) Strained chicken noodle soup Hard Candy   MEDICATION INSTRUCTIONS  Unless otherwise instructed, you should take regular prescription medications with a small sip of water as early as possible the morning of your procedure.                 OTHER INSTRUCTIONS  You will need a responsible adult at least 46 years of age to accompany you and drive you home.   This person must remain in the waiting room during your procedure.  Wear loose fitting clothing that is easily removed.  Leave jewelry and other valuables at home.  However, you may wish to bring a book to read or an iPod/MP3 player to listen to music as you wait for your procedure to start.  Remove all body piercing jewelry and leave at home.  Total time from sign-in until discharge is approximately 2-3 hours.  You should go  home directly after your procedure and rest.  You can resume normal activities the day after your procedure.  The day of your procedure you should not:   Drive   Make legal decisions   Operate machinery   Drink alcohol   Return to work  You will receive specific instructions about eating, activities and medications before you leave.    The above instructions have been reviewed and explained to me by  Hortense Ramal, CMA    I fully understand and can verbalize these instructions _____________________________ Date 09/15/09

## 2010-09-08 NOTE — Progress Notes (Signed)
Summary: Switch in Primary Care  Phone Note Call from Patient Call back at [phone]   Caller: Patient Summary of Call: Pt currently sees Sandford Craze and would like to change to Dr. Drue Novel. Is this ok? I informed pt that this would have to be ok'd by both.  Initial call taken by: Lavell Islam,  April 20, 2010 11:55 AM  Follow-up for Phone Call        OK with me.  Follow-up by: Lemont Fillers FNP,  April 20, 2010 12:09 PM  Additional Follow-up for Phone Call Additional follow up Details #1::        advise patient, okay with me, please set  an appointment Additional Follow-up by: [facility]. Paz MD,  April 20, 2010 1:31 PM    Additional Follow-up for Phone Call Additional follow up Details #2::    appt scheduled for tomorrow 9/14 @ 12:40 with Dr. Drue Novel Follow-up by: Lavell Islam,  April 21, 2010 9:47 AM

## 2010-09-08 NOTE — Procedures (Signed)
Summary: Upper Endoscopy  Patient: Danny Henderson Note: All result statuses are Final unless otherwise noted.  Tests: (1) Upper Endoscopy (EGD)   EGD Upper Endoscopy       DONE     [facility]     [address]     [place], [place]  [address]           ENDOSCOPY PROCEDURE REPORT           PATIENT:  Danny Henderson, Danny Henderson  MR#:  [id number]     BIRTHDATE:  [birth year], 43 yrs. old  GENDER:  male           ENDOSCOPIST:  Hedwig Morton. Juanda Chance, MD     Referred by:  Sandford Craze, FNP           PROCEDURE DATE:  09/18/2009     PROCEDURE:  EGD with biopsy     ASA CLASS:  Class I     INDICATIONS:  chest pain, abdominal pain hx opf treated H.Pylori,     refractory to nexiem 40 mg po qd           MEDICATIONS:   Versed 7 mg, Fentanyl 50 mcg     TOPICAL ANESTHETIC:  Exactacain Spray           DESCRIPTION OF PROCEDURE:   After the risks benefits and     alternatives of the procedure were thoroughly explained, informed     consent was obtained.  The LB GIF-H180 [id number] endoscope was     introduced through the mouth and advanced to the second portion of     the duodenum, without limitations.  The instrument was slowly     withdrawn as the mucosa was fully examined.     <<PROCEDUREIMAGES>>           A hiatal hernia was found (see image1). 2 cm sliding hiatal hernia     A stricture was found in the distal esophagus. mild nonobstructing     esophageal fibrous ring With standard forceps, a biopsy was     obtained and sent to pathology (see image2, image7, and image8).     Mild gastritis was found. mucosal hemorrhages antrum With standard     forceps, a biopsy was obtained and sent to pathology (see image5     and image3).  Otherwise the examination was normal (see image6 and     image4).    Retroflexed views revealed no abnormalities.    The     scope was then withdrawn from the patient and the procedure     completed.           COMPLICATIONS:  None           ENDOSCOPIC IMPRESSION:     1)  Hiatal hernia     2) Stricture in the distal esophagus     3) Mild gastritis     4) Otherwise normal examination     nonobstructing fibrous ring distal esophagus     pt's symptoma are likely due to GERD and/or HHernia     RECOMMENDATIONS:     1) await biopsy results     2) anti-reflux regimen to be follow     Nexiem 40 mg po bid           REPEAT EXAM:  In 0 year(s) for.           ______________________________     Hedwig Morton. Juanda Chance, MD  CC:           n.     eSIGNED:   Daionna Crossland M. Braydon Kullman at 09/18/2009 02:40 PM           Leona Carry, [id number]  Note: An exclamation mark (!) indicates a result that was not dispersed into the flowsheet. Document Creation Date: 09/18/2009 2:40 PM _______________________________________________________________________  (1) Order result status: Final Collection or observation date-time: 09/18/2009 14:29 Requested date-time:  Receipt date-time:  Reported date-time:  Referring Physician:   Ordering Physician: Lina Sar [phone]) Specimen Source:  Source: Launa Grill Order Number: [id number] Lab site:

## 2010-09-08 NOTE — Progress Notes (Signed)
Summary: Triage  Phone Note From Other Clinic   Caller: [facility]  [phone] Call For: Dr. Juanda Chance Summary of Call: Office called because pt. has an appt. scheduled on 10-07-09 and wants to know if he can be seen any sooner Initial call taken by: Karna Christmas,  August 25, 2009 1:36 PM  Follow-up for Phone Call        Pt. wants an ASAP appt, per Myriam Jacobson.  Pt. will see Mike Gip [facility] on 08-26-09 at 9:30am. Myriam Jacobson will advise pt. of appt/med.list/co-pay/cx.policy. Follow-up by: Laureen Ochs LPN,  August 25, 2009 2:07 PM     Appended Document: Triage Per Myriam Jacobson, pt. cannot come tomorrow, wants to wait to see Dr.Nasra Counce. He will see Dr.Bricen Victory on 09-15-09 at 2pm.

## 2010-09-08 NOTE — Assessment & Plan Note (Signed)
Summary: 3 month followup/alr   Vital Signs:  Patient profile:   46 year old male Height:      72 inches Weight:      212.50 pounds BMI:     28.92 Temp:     97.8 degrees F oral Pulse rate:   66 / minute Pulse rhythm:   regular Resp:     16 per minute BP sitting:   122 / 70  (right arm) Cuff size:   regular  Vitals Entered By: Mervin Kung CMA (December 01, 2009 8:17 AM) CC: room 5  3 month follow up Is Patient Diabetic? No   Primary Care Provider:  Sandford Craze, MD  CC:  room 5  3 month follow up.  History of Present Illness: Danny Henderson is a 45 year old male who presents today for follow up.  Since last visit he has seen Dr. Lina Sar and undergone EGD which noted gastritis/hiatal hernia.  He has been taking Nexium and watching his diet wit some improvement in his symptoms of mild epigastric discomfort.  Blood pressure- patient notes that he has been trying to avoid OTC sinus preps with Sudafed.    Hyperlipidemia- pt completed FLP last week.   Allergies (verified): No Known Drug Allergies  Physical Exam  General:  Well-developed,well-nourished,in no acute distress; alert,appropriate and cooperative throughout examination Lungs:  Normal respiratory effort, chest expands symmetrically. Lungs are clear to auscultation, no crackles or wheezes. Heart:  Normal rate and regular rhythm. S1 and S2 normal without gallop, murmur, click, rub or other extra sounds.   Impression & Recommendations:  Problem # 1:  HIATAL HERNIA (ICD-553.3) Assessment New Plan to continue diet modification, PPI.   His updated medication list for this problem includes:    Nexium 40 Mg Cpdr (Esomeprazole magnesium) .Marland Kitchen... Take 1 capsule daily  Problem # 2:  ELEVATED BLOOD PRESSURE WITHOUT DIAGNOSIS OF HYPERTENSION (ICD-796.2) Assessment: Improved BP was stable today.  He did complete a 2-D echo at the suggestion of Dr. Doylene Bode due to incidental finding of RBBB on 2-d echo. 2-D echo  noted normal wall motion and normal EF.  There was note of grade1 diastolic dysfunction.  Pt was instructed to to watch his sodium, and exercise regularly.  Plan follow up in 6 months.  BP today: 122/70 Prior BP: 140/88 (09/15/2009)  Labs Reviewed: Creat: 1.21 (08/13/2009) Chol: 176 (11/24/2009)   HDL: 41.50 (11/24/2009)   LDL: 146 (09/01/2009)   TG: 212.0 (11/24/2009)  Instructed in low sodium diet (DASH Handout) and behavior modification.    Problem # 3:  HYPERLIPIDEMIA (ICD-272.4) Assessment: Deteriorated  LDL and total cholesterol are good.  Pt is not on statin.  His trigs are up this time.  Pt was advised to make dietary changes as per instructions.  Will repeat in 6 months.  Labs Reviewed: SGOT: 23 (08/13/2009)   SGPT: 33 (08/13/2009)   HDL:41.50 (11/24/2009), 64 (09/01/2009)  LDL:146 (09/01/2009)  Chol:176 (11/24/2009), 236 (09/01/2009)  Trig:212.0 (11/24/2009), 132 (09/01/2009)  Complete Medication List: 1)  Nexium 40 Mg Cpdr (Esomeprazole magnesium) .... Take 1 capsule daily  Patient Instructions: 1)  Please follow up in 6 months (pls come fasting). 2)  Your triglycerides are high.  Please make the following nutritional changes: 3)  1.  Avoid white bread, white pasta and white rice 4)  2.  Avoid high fructose corn syrup 5)  3.  Instead eat brown carbs- Yams, Wheat pasta, whole grained breads, wild rice. 6)  Limit your sugars to  less than 40 grams a day from labeled foods and drinks. 7)  Have a nice Spring!  Current Allergies (reviewed today): No known allergies

## 2010-09-08 NOTE — Letter (Signed)
Summary: New Patient letter  [facility]  [address] [place], [place] [address]   Phone: [phone]  Fax: [phone]       08/14/2009 MRN: [id number]  Danny Henderson [address] [place], [place]  [address]  Dear Danny Henderson,  Welcome to the Gastroenterology Division at [facility].    You are scheduled to see Dr.  Lina Sar on October 07, 2009 at 10:30am on the 3rd floor at [facility], [address]  We ask that you try to arrive at our office 15 minutes prior to your appointment time to allow for check-in.  We would like you to complete the enclosed self-administered evaluation form prior to your visit and bring it with you on the day of your appointment.  We will review it with you.  Also, please bring a complete list of all your medications or, if you prefer, bring the medication bottles and we will list them.  Please bring your insurance card so that we may make a copy of it.  If your insurance requires a referral to see a specialist, please bring your referral form from your primary care physician.  Co-payments are due at the time of your visit and may be paid by cash, check or credit card.     Your office visit will consist of a consult with your physician (includes a physical exam), any laboratory testing he/she may order, scheduling of any necessary diagnostic testing (e.g. x-ray, ultrasound, CT-scan), and scheduling of a procedure (e.g. Endoscopy, Colonoscopy) if required.  Please allow enough time on your schedule to allow for any/all of these possibilities.    If you cannot keep your appointment, please call [phone] to cancel or reschedule prior to your appointment date.  This allows [place] the opportunity to schedule an appointment for another patient in need of care.  If you do not cancel or reschedule by 5 p.m. the business day prior to your appointment date, you will be charged a $50.00 late cancellation/no-show fee.    Thank you for  choosing [facility] for your medical needs.  We appreciate the opportunity to care for you.  Please visit [place] at our website  to learn more about our practice.                     Sincerely,                                                             The Gastroenterology Division

## 2010-09-08 NOTE — Progress Notes (Signed)
Summary: left msg for pt to call  Phone Note Outgoing Call   Summary of Call: discussed cholesterol results with patient.  Will plan diet and exercise x 3 months (patient advised on low cholesterol diet).  An then have patient return for FLP.  May need to consider statin at that time if LDL is not improved. Initial call taken by: Lemont Fillers FNP,  September 02, 2009 12:45 PM  Follow-up for Phone Call        Please call Danny Henderson and arrange a follow up appointment in 3 months with a FLP one week before.  (272.4) Thanks Follow-up by: Lemont Fillers FNP,  September 02, 2009 12:47 PM  Additional Follow-up for Phone Call Additional follow up Details #1::        left msg for pt to call .Kandice Hams  September 02, 2009 1:33 PM   New Problems: HYPERLIPIDEMIA (ICD-272.4)   Additional Follow-up for Phone Call Additional follow up Details #2::    SPOKE WITH PT 09/02/09 OV SCHEDULED, LAB SCHEDULED LEFT MSG FOR PT OF LAB APPT  Follow-up by: Kandice Hams,  September 03, 2009 1:08 PM  New Problems: HYPERLIPIDEMIA (ICD-272.4)

## 2010-09-10 NOTE — Progress Notes (Signed)
Summary: REFILL  Phone Note Refill Request Call back at [phone] Message from:  Patient on July 24, 2010 10:42 AM  Refills Requested: Medication #1:  NEXIUM 40 MG CPDR Take 1 capsule daily   Dosage confirmed as above?Dosage Confirmed   Supply Requested: 1 month [facility] [address]   Next Appointment Scheduled: NONE Initial call taken by: Lavell Islam,  July 24, 2010 10:42 AM    Prescriptions: NEXIUM 40 MG CPDR (ESOMEPRAZOLE MAGNESIUM) Take 1 capsule daily  #30 x 3   Entered by:   Army Fossa CMA   Authorized by:   Nolon Rod. Paz MD   Signed by:   Army Fossa CMA on 07/24/2010   Method used:   Electronically to        [facility] [phone]* (retail)       [address]       [place], [place]  [address]       Ph: [phone]       Fax: [phone]   RxID:   [id number]

## 2010-09-10 NOTE — Assessment & Plan Note (Signed)
Summary: followup for nexium refill/kn   Vital Signs:  Patient profile:   45 year old male Weight:      209.25 pounds Pulse rate:   72 / minute Pulse rhythm:   regular BP sitting:   126 / 82  (left arm) Cuff size:   regular  Vitals Entered By: Army Fossa CMA (August 26, 2010 2:19 PM) CC: Follow up visit- not fasting  Comments no complaints  [facility] [address]    History of Present Illness: follow up from the previous visit He was seen here with lower extremity pain, thought to be due to DJD, he try meloxicam for 2 days with no help. He did not have any side effects  additionally, he has some problems with ED for  few months, erections are less lasting and less strong, he tried Viagra from a friend and it worked well. He can only tolerate 25 mg, higher doses caused flushing without any additional benefits  ROS He continued to be very active at work, does a lot of walking and standing GERD symptoms well controlled with UTIs No lower extremity edema Denies claudication per se     Current Medications (verified): 1)  Nexium 40 Mg Cpdr (Esomeprazole Magnesium) .... Take 1 Capsule Daily 2)  Meloxicam 7.5 Mg Tabs (Meloxicam) .Marland Kitchen.. 1 or 2 By Mouth Once Daily With Food As Needed Pain  Allergies (verified): No Known Drug Allergies  Past History:  Past Medical History: Reviewed history from 04/22/2010 and no changes required. Allergic rhinitis GERD       Past Surgical History: Reviewed history from 09/09/2009 and no changes required. Right Elbow Surgery  Social History: Reviewed history from 04/22/2010 and no changes required. Occupation: Self Employed, Social research officer, government, car selling  Married   1 son 17  Alcohol Use - yes Illicit Drug Use - no Patient has never smoked.  Daily Caffeine Use 3 per day  Physical Exam  General:  alert, well-developed, and well-nourished.   Abdomen:  soft and non-tender.   Pulses:  normal male pulses normal pedal  pulses Extremities:  no lower extremity edema  Inspection and palpation of  his feet and ankles normal. no tender to palpation at the heel or at the base of the second and third toes   Impression & Recommendations:  Problem # 1:  PAIN IN JOINT, MULTIPLE SITES (ICD-719.49)  continuing with pain mostly in the feet and ankle Meloxicam did not help He reports that previously saw a podriatist, used inserts, has tried different types of shoes without much improvement on exam he does not have a exaggerated arch and is not flat-footed. Plan: Try a different anti-inflammatory---- diclofenac Use Tylenol as needed Refer to physical therapy to see if posture correction or stretching could make a difference  Orders: Physical Therapy Referral (PT)  Problem # 2:  ERECTILE DYSFUNCTION, NON-ORGANIC (ICD-302.72) see history of present illness, prescribe viagra His updated medication list for this problem includes:    Viagra 50 Mg Tabs (Sildenafil citrate) .Marland Kitchen... As directed  Complete Medication List: 1)  Nexium 40 Mg Cpdr (Esomeprazole magnesium) .... Take 1 capsule daily 2)  Diclofenac Sodium 75 Mg Tbec (Diclofenac sodium) .Marland Kitchen.. 1 by mouth two times a day with food as needed for pain 3)  Viagra 50 Mg Tabs (Sildenafil citrate) .... As directed  Patient Instructions: 1)  diclofenac 2)  you can add tylenol 500mg  1 or 2 every 6 hours as needed 3)  Please schedule a follow-up appointment in 6 months .  4)  ok to take viagra 50mg  1/2 a day as needed  Prescriptions: VIAGRA 50 MG TABS (SILDENAFIL CITRATE) as directed  #10 x 2   Entered and Authorized by:   Nolon Rod. Tressa Maldonado MD   Signed by:   Nolon Rod. Javionna Leder MD on 08/26/2010   Method used:   Print then Give to Patient   RxID:   [id number] DICLOFENAC SODIUM 75 MG TBEC (DICLOFENAC SODIUM) 1 by mouth two times a day with food as needed for pain  #60 x 3   Entered and Authorized by:   Nolon Rod. Zavon Hyson MD   Signed by:   Nolon Rod. Leighann Amadon MD on 08/26/2010   Method  used:   Printed then faxed to ...       [facility]  [address] [phone]* (retail)       [address]       [place], [place]  [address]       Ph: [phone]       Fax: [phone]   RxID:   [id number] NEXIUM 40 MG CPDR (ESOMEPRAZOLE MAGNESIUM) Take 1 capsule daily  #30 x 6   Entered by:   Army Fossa CMA   Authorized by:   Nolon Rod. Jarelly Rinck MD   Signed by:   Army Fossa CMA on 08/26/2010   Method used:   Electronically to        [facility] [address] [phone]* (retail)       [address]       [place], [place]  [address]       Ph: [phone]       Fax: [phone]   RxID:   [id number]    Orders Added: 1)  Physical Therapy Referral [PT] 2)  Est. Patient Level III [25427]

## 2010-12-25 NOTE — Op Note (Signed)
[place]. [facility]  Patient:    Danny Henderson, Danny Henderson                       MRN: [id number] Proc. Date: 02/16/01 Adm. Date:  [id number] Disc. Date: [id number] Attending:  Colbert Ewing                           Operative Report  PREOPERATIVE DIAGNOSES: 1. Nonunion lateral epicondyle fracture, distal humerus, right elbow. 2. Retained loose screws and washer. 3. Overlying ganglion cyst.  POSTOPERATIVE DIAGNOSES: 1. Nonunion lateral epicondyle fracture, distal humerus, right elbow. 2. Retained loose screws and washer. 3. Overlying ganglion cyst.  PROCEDURES: 1. Exploration lateral aspect, right elbow. 2. Excision ganglion and removal of two screws washers. 3. Excision nonunited epicondyle fragment and primary repair, extensor common    tendon back to epicondyle, with a 5 mm Statak anchor.  SURGEON:  Loreta Ave, M.D.  ASSISTANT:  Arlys John D. Petrarca, P.A.-C.  ANESTHESIA:  General.  ESTIMATED BLOOD LOSS:  Minimal.  TOURNIQUET TIME:  1 hour 10 minutes.  SPECIMENS:  None.  CULTURES:  None.  COMPLICATIONS:  None.  DRESSING:  Soft compressive with a long-arm splint.  DESCRIPTION OF PROCEDURE:  Patient brought to the operating room and placed on the operating table in supine position.  After adequate anesthesia had been obtained, right arm examined.  A 30 degree flexion contracture, otherwise full flexion, good stability, full pronation and supination.  Tourniquet applied, prepped and draped in the usual sterile fashion.  Exsanguinated with elevation and Esmarch, tourniquet inflated to 250 mmHg.  Previous lateral incision was very posterior.  I therefore made another incision anterolaterally directly over the ganglion and loose screws.  Skin and subcutaneous tissue divided. The underlying ganglion, which was 2-3 cm in size, was resected.  Within this was one of the loose screws and washers.  Two screws identified and removed along with their  washers, as they were not providing any fixation.  The epicondyle, which was a fairly good-sized fragment and still attached to about 80% of the common extensor tendon, was completely nonunited and mobile.  This was shelled out and given how long it had been there, greater than 15 years, I elected to remove this fragment and do a primary repair of the extensor tendons rather than trying to obtain bony healing through a well-established pseudoarthrosis.  Bony fragment removed.  Wound irrigated.  Still excellent bone stock and stability at the elbow.  Common extensor tendon was then captured with #2 Ethibond.  A 5 mm Statak placed in the epicondyle and the humerus with fluoroscopic guidance.  The anchor sutures were then sutured back to the sutures in the extensor tendon.  I was able to gather the anterior and posterior aspect, which still had a humeral attachment, and incorporate this into the repair so at completion I had re-establishment in a good position of all the extensor tendons down to a bleeding bed of bone in the lateral epicondylar region.  Full passive motion without undue tension on the repair. Wound was irrigated.  Skin closed with Vicryl and nylon.  Margins of the wound injected with Marcaine.  A sterile compressive dressing and a long-arm splint applied.  Tourniquet deflated and removed.  Anesthesia reversed.  Brought to the recovery room.  Tolerated the surgery well with no complications. DD:  02/17/01 TD:  02/17/01 Job: [id number]

## 2010-12-25 NOTE — Cardiovascular Report (Signed)
[place]. [facility]  Patient:    Danny Henderson, Danny Henderson Visit Number: [id number] MRN: [id number]          Service Type: MED Location: 3700 3735 [id number] Attending Physician:  Nelta Numbers Dictated by:   Noralyn Pick Eden Emms, M.D. [facility] Proc. Date: 08/16/01 Admit Date:  08/15/2001                          Cardiac Catheterization  INDICATIONS:  Recurrent chest pain requiring ER visits.  PROCEDURE PERFORMED:  Catheterization was done from the right femoral artery using Judkins catheters.  RESULTS:  Left main coronary artery was normal.  Left anterior descending artery was normal.  Circumflex coronary artery was nondominant and normal.  Right coronary artery was dominant and normal.  RAO VENTRICULOGRAPHY:  RAO ventriculography was normal.  Ejection fraction was 65%.  There is no gradient across the aortic valve and no MR.  LV pressure was 123/15 and aortic pressure was 123/70.  IMPRESSION:  The patient has very pristine looking coronary arteries with no evidence of coronary artery disease.  His chest pain is likely to be noncardiac in etiology.  He will be discharged home later today if his groin heals well.  Followup will be with Dr. Rondel Jumbo and his primary care M.D. Dictated by:   Noralyn Pick Eden Emms, M.D. [facility] Attending Physician:  Nelta Numbers DD:  08/16/01 TD:  08/16/01 Job: [id number]

## 2010-12-25 NOTE — Discharge Summary (Signed)
[place]. [facility]  Patient:    Danny Henderson, Danny Henderson Visit Number: [id number] MRN: [id number]          Service Type: MED Location: 3700 3735 01 Attending Physician:  Nelta Numbers Dictated by:   Tereso Newcomer, P.A. Admit Date:  08/15/2001 Disc. Date: 08/16/01   CC:         Angelena Sole, M.D. [facility]   Discharge Summary  DATE OF BIRTH:  01/24/1966  DISCHARGE DIAGNOSES: 1. Chest pain, etiology unclear. 2. Hypertension. 3. Anxiety disorder.  PROCEDURE:  Cardiac catheterization by Dr. Charlton Haws, revealing normal coronaries, normal LV, EF 65%.  HISTORY OF PRESENT ILLNESS:  This 45 year old male presented to the emergency room with a 10- to 100-month history of chest pain described as being located in the left chest with radiation to his left arm, associated with shortness of breath and positive response to nitroglycerin.  He described his pain as a pressure sensation.  He underwent stress Cardiolite testing June 29, 2001, which revealed no ischemia and normal wall motion, EF 59%.  The patient had noted episodes to be infrequent since his discharge but had increased for the prior seven days to admission.  He noted his pain occurred at rest as well as with exertion, and there was no change with food.  PHYSICAL EXAMINATION:  VITAL SIGNS:  On initial exam in the emergency room his blood pressure was 132/80, pulse 73, respirations 16.  NECK:  Without JVD or bruit.  CARDIAC:  Normal S1, S2.  No murmurs.  LUNGS:  Clear to auscultation.  ASSESSMENT:  Soft.  EXTREMITIES:  Without edema.  LABORATORY DATA:  Chest x-ray showed no acute disease.  EKG revealed sinus rhythm, heart rate 71, no acute changes.  HOSPITAL COURSE:  It was decided he should be admitted to [facility]. He was placed on his usual medications, and Norvasc was added to his medical regimen.  It was felt that cardiac catheterization was warranted to  further evaluate his symptoms since they were becoming interruptive to his lifestyle. He went for cardiac catheterization on August 16, 2001, by Dr. Charlton Haws, as noted above.  Given his normal results it was felt he could be discharged to home after an appropriate rest period and hydration was complete.  He will be discharged to home on his usual medications and Norvasc 2.5 mg a day.  This was added for question of coronary spasm contributing to his symptomatology. It was also suggested that he could take a baby aspirin a day.  LABORATORY DATA:  White blood cell count 7000, hemoglobin 14.7, hematocrit 43.2, platelet count 197,000.  INR 1.0.  Sodium 141, potassium 4.3, chloride 106, CO2 30, glucose 84, BUN 12, creatinine 1.0.  Alkaline phosphatase 83, total bilirubin 0.6, AST 27, ALT 27, total protein 7.4, albumin 4.3, calcium 9.2.  Cardiac enzymes negative x3.  DISCHARGE MEDICATIONS: 1. Lopressor 25 mg b.i.d. 2. Protonix 40 mg a day. 3. Zoloft 100 mg a day. 4. Norvasc 2.5 mg a day. 5. Coated aspirin 81 mg a day.  ACTIVITY:  No heavy lifting, exertion, work, or driving for three days.  DIET:  Low-fat, low-sodium.  DISCHARGE INSTRUCTIONS:  He is to call our office in [facility] for any groin swelling, bleeding, or bruising.  FOLLOW-UP:  He should see Dr. Ruthine Dose in one to two weeks, and he should call for an appointment. Dictated by:   Tereso Newcomer, P.A. Attending Physician:  Nelta Numbers DD:  08/16/01 TD:  08/16/01 Job: [id number]

## 2010-12-25 NOTE — Discharge Summary (Signed)
[place]. [facility]  Patient:    Danny Henderson, PROVENCAL Visit Number: [id number] MRN: [id number]          Service Type: MED Location: 2000 2035 01 Attending Physician:  Pricilla Riffle Dictated by:   Tereso Newcomer, P.A. Admit Date:  06/28/2001 Discharge Date: 06/29/2001   CC:         Angelena Sole, M.D. [facility]   Discharge Summary  DATE OF BIRTH:  [birth year]  DISCHARGE DIAGNOSES: 1. Chest pain, etiology unclear. 2. Hypertension.  HOSPITAL COURSE:  This 45 year old male had recently been seen in the office by Dr. Tenny Craw for substernal chest pain for approximately six to eight months. He noted that the pain did come on with minimal stress and was associated with shortness of breath and some radiation to his left side.  He also noted the pain would ease on its own.  He denied association with activity.  Of note, he found that he could go mountain biking for 10 miles without problems and actually felt better.  He took nitroglycerin x 2 on the morning of admission that eventually eased and he came to the emergency room.  On exam, his blood pressure was 153/101, pulse 77.  Neck without JVD.  Lungs clear to auscultation.  Cardiac regular rate and rhythm, normal S1, S2.  Abdomen soft, nontender.  Extremities without clubbing, cyanosis or edema.  His chest x-ray showed no acute disease.  EKG revealed a rate of 94, normal sinus rhythm, no ischemic changes.  Spiral chest CT was done to rule out pulmonary embolism. The patient ruled out for MI by enzymes x 3.  On the morning on June 29, 2001 he went for an exercise treadmill Cardiolite.  He exercised into stage 4 without chest pain or EKG changes.  His images returned revealing no ischemia and an EF of 59%.  Lipid panel was checked and this revealed a total cholesterol 184, triglycerides 154, HDL 45, LDL 108.  Homocysteine level is pending at the time of this dictation.  The patient was placed on empiric proton pump  inhibitor upon admission.  He would go home on this.  His HCTZ was discontinued and he was started on Lopressor 25 mg b.i.d. for high blood pressure.  His blood pressure was better controlled on beta blocker and he would also be discharged to home on this medication.  He would be asked to follow up with his primary care physician and see Dr. Tenny Craw in two to three weeks in follow-up.  Of note, he did have a stress echocardiogram scheduled and that has been cancelled secondary to his stress test done this admission.  LABORATORY DATA:  White blood cell count 7700; hemoglobin 16.7; hematocrit 47.9; platelet count 229,000.  INR 0.9.  Sodium 135, potassium 3.7, chloride 103, CO2 23, glucose 94, BUN 12, creatinine 1.0, AST 35, ALT 41, total protein 8.1, total bilirubin 0.8, alkaline phosphatase 87.  CK-MB and troponin I negative x 3.  Lipid panel - see hospital course.  DISCHARGE MEDICATIONS: 1. Enteric-coated aspirin 325 mg q.d. 2. Lopressor 25 mg b.i.d. 3. Protonix 40 mg q.d.  ACTIVITY:  As tolerated.  DIET:  Low fat/low sodium.  FOLLOW-UP:  He has been asked to follow up with Dr. Ruthine Dose in one to two weeks and he should call that office for an appointment.  He will see Dr. Tenny Craw on July 13, 2001 at 9 a.m. at our office in [place].Dictated by:   Tereso Newcomer, P.A.  Attending Physician:  Pricilla Riffle DD:  06/29/01 TD:  06/29/01 Job: [id number]

## 2011-02-17 ENCOUNTER — Other Ambulatory Visit: Payer: Self-pay | Admitting: Internal Medicine

## 2011-02-17 MED ORDER — ESOMEPRAZOLE MAGNESIUM 40 MG PO CPDR: 40.0000 mg | DELAYED_RELEASE_CAPSULE | Freq: Every day | ORAL | Status: DC

## 2011-02-17 NOTE — Telephone Encounter (Signed)
Sent in meds 

## 2011-04-17 ENCOUNTER — Other Ambulatory Visit: Payer: Self-pay | Admitting: Internal Medicine

## 2011-04-19 NOTE — Telephone Encounter (Signed)
Patient has called for status---said she told pharmacy last week--pt is out--please call prescription in as soon as possible

## 2011-04-20 NOTE — Telephone Encounter (Signed)
Rx Done . 

## 2011-07-14 ENCOUNTER — Other Ambulatory Visit: Payer: Self-pay | Admitting: Internal Medicine

## 2011-07-15 NOTE — Telephone Encounter (Signed)
Pt changed PCP to Dr Drue Novel in September 2011 and last saw him in January 2012. Please have him approve or deny request.

## 2011-07-19 NOTE — Telephone Encounter (Signed)
Rx sent to pharmacy, after Pt wife called complaining that pharmacy advise them that they have sent several request without any response. Advise wife Rx sent in to pharmacy and OV due.

## 2011-09-02 ENCOUNTER — Other Ambulatory Visit: Payer: Self-pay | Admitting: Internal Medicine

## 2011-09-02 NOTE — Telephone Encounter (Signed)
Last OV 08/26/10. Last fill date 10/02/10

## 2011-09-02 NOTE — Telephone Encounter (Signed)
Pt has not seen Melissa since 2011, saw Dr Drue Novel in 2012. Please refer request to Dr Drue Novel.

## 2011-09-03 NOTE — Telephone Encounter (Signed)
Ok 10, no further RF w/o OV

## 2011-09-13 ENCOUNTER — Encounter: Payer: Self-pay | Admitting: Internal Medicine

## 2011-09-17 ENCOUNTER — Encounter: Payer: Self-pay | Admitting: Internal Medicine

## 2011-10-20 ENCOUNTER — Ambulatory Visit (INDEPENDENT_AMBULATORY_CARE_PROVIDER_SITE_OTHER): Payer: 59 | Admitting: Internal Medicine

## 2011-10-20 DIAGNOSIS — R030 Elevated blood-pressure reading, without diagnosis of hypertension: Secondary | ICD-10-CM

## 2011-10-20 DIAGNOSIS — Z0000 Encounter for general adult medical examination without abnormal findings: Secondary | ICD-10-CM

## 2011-10-20 LAB — CBC WITH DIFFERENTIAL/PLATELET
Eosinophils Relative: 0.4 % (ref 0.0–5.0)
HCT: 46 % (ref 39.0–52.0)
Hemoglobin: 15.5 g/dL (ref 13.0–17.0)
Lymphs Abs: 2.2 10*3/uL (ref 0.7–4.0)
Monocytes Relative: 10.5 % (ref 3.0–12.0)
Neutro Abs: 4 10*3/uL (ref 1.4–7.7)
WBC: 7 10*3/uL (ref 4.5–10.5)

## 2011-10-20 LAB — TSH: TSH: 1.53 u[IU]/mL (ref 0.35–5.50)

## 2011-10-20 LAB — COMPREHENSIVE METABOLIC PANEL
ALT: 44 U/L (ref 0–53)
AST: 33 U/L (ref 0–37)
Albumin: 4.3 g/dL (ref 3.5–5.2)
Calcium: 9.4 mg/dL (ref 8.4–10.5)
Chloride: 103 mEq/L (ref 96–112)
Potassium: 4 mEq/L (ref 3.5–5.1)

## 2011-10-20 LAB — LDL CHOLESTEROL, DIRECT: Direct LDL: 116.4 mg/dL

## 2011-10-20 MED ORDER — LOSARTAN POTASSIUM 50 MG PO TABS: 50.0000 mg | ORAL_TABLET | Freq: Every day | ORAL | Status: DC

## 2011-10-20 NOTE — Assessment & Plan Note (Signed)
Td 2011 Diet and exercise discussed Labs He has a family history of heart disease, I told pt that controlling his cardiovascular risk factors is paramount to prevent cardiovascular events. I'll set a goal of LDL around 100. See "elevated BP"

## 2011-10-20 NOTE — Patient Instructions (Signed)
Came back for labs only in 1 month: BMP dx elevated BP Check the  blood pressure 2 or 3 times a week, be sure it is less than 140/85 and at around 120/80 . If it is consistently higher, let me know

## 2011-10-20 NOTE — Assessment & Plan Note (Signed)
amb BPs 120-130/80-90 Goal 120/80 d/t FH CAD Pt agreed to start losartan
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
## 2011-10-21 ENCOUNTER — Encounter: Payer: Self-pay | Admitting: Internal Medicine

## 2011-10-26 ENCOUNTER — Other Ambulatory Visit: Payer: Self-pay | Admitting: Internal Medicine

## 2011-10-26 ENCOUNTER — Encounter: Payer: Self-pay | Admitting: *Deleted

## 2011-10-26 NOTE — Telephone Encounter (Signed)
Refill done.  

## 2011-11-24 ENCOUNTER — Other Ambulatory Visit: Payer: 59

## 2012-03-11 ENCOUNTER — Other Ambulatory Visit: Payer: Self-pay | Admitting: Internal Medicine

## 2012-03-13 NOTE — Telephone Encounter (Signed)
Refill done.  

## 2012-05-30 ENCOUNTER — Other Ambulatory Visit: Payer: Self-pay | Admitting: Internal Medicine

## 2012-05-30 NOTE — Telephone Encounter (Signed)
Refill done.  

## 2012-12-07 ENCOUNTER — Other Ambulatory Visit: Payer: Self-pay | Admitting: Internal Medicine

## 2012-12-07 ENCOUNTER — Encounter: Payer: Self-pay | Admitting: *Deleted

## 2012-12-07 NOTE — Telephone Encounter (Signed)
Called pt, unable to leave a msg.  Letter mailed to pt to return call & scheduled CPE.  Refill done.

## 2012-12-07 NOTE — Telephone Encounter (Signed)
Pt has not been seen within a yea. Ok to refill?

## 2012-12-07 NOTE — Telephone Encounter (Signed)
Please schedule a CPX Ok RF #30, 1 RF

## 2012-12-12 ENCOUNTER — Other Ambulatory Visit: Payer: Self-pay | Admitting: Internal Medicine

## 2012-12-12 NOTE — Telephone Encounter (Signed)
Refill done.  Letter mailed to pt to make aware he is due for an OV.  

## 2013-01-16 ENCOUNTER — Other Ambulatory Visit: Payer: Self-pay | Admitting: Internal Medicine

## 2013-01-16 NOTE — Telephone Encounter (Signed)
Scheduled pt for CPE 6.24.14 @ 8am.  Refill done.

## 2013-01-30 ENCOUNTER — Encounter: Payer: 59 | Admitting: Internal Medicine

## 2013-02-21 ENCOUNTER — Other Ambulatory Visit: Payer: Self-pay | Admitting: Internal Medicine

## 2013-02-21 NOTE — Telephone Encounter (Signed)
Ok to refill?  Pt. Last OV was 3.13.13, pt. Has future scheduled OV 8.14.14

## 2013-02-23 NOTE — Telephone Encounter (Signed)
Refill done.  

## 2013-02-23 NOTE — Telephone Encounter (Signed)
Okay one-month, no refills

## 2013-03-22 ENCOUNTER — Encounter: Payer: 59 | Admitting: Internal Medicine

## 2013-03-22 DIAGNOSIS — Z0289 Encounter for other administrative examinations: Secondary | ICD-10-CM

## 2013-05-14 ENCOUNTER — Telehealth: Payer: Self-pay

## 2013-05-14 NOTE — Telephone Encounter (Signed)
Medication List and allergies: done  Pharmacy updated, uses NA for 90 day supply Pharmacy undated, uses [facility] [address]  For prescriptions  A/P: HM due: Flu vaccine PSA: NA Last: CCS: NA Last: DM: NA HTN: WNL  To Discuss with Provider: NA

## 2013-05-15 ENCOUNTER — Encounter: Payer: Self-pay | Admitting: Internal Medicine

## 2013-05-15 ENCOUNTER — Ambulatory Visit (INDEPENDENT_AMBULATORY_CARE_PROVIDER_SITE_OTHER): Payer: 59 | Admitting: Internal Medicine

## 2013-05-15 VITALS — BP 156/97 | HR 74 | Temp 98.8°F | Ht 71.5 in | Wt 212.8 lb

## 2013-05-15 DIAGNOSIS — Z0000 Encounter for general adult medical examination without abnormal findings: Secondary | ICD-10-CM

## 2013-05-15 DIAGNOSIS — I10 Essential (primary) hypertension: Secondary | ICD-10-CM

## 2013-05-15 DIAGNOSIS — Z23 Encounter for immunization: Secondary | ICD-10-CM

## 2013-05-15 MED ORDER — LOSARTAN POTASSIUM 50 MG PO TABS: 50.0000 mg | ORAL_TABLET | Freq: Every day | ORAL | Status: DC

## 2013-05-15 NOTE — Assessment & Plan Note (Signed)
Run out of losartan 2 months ago, no ambulatory BPs Plan:  Restart losartan Labs in 2 or 3 weeks Self BP monitoring, see instructions Office visit 6 months
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
## 2013-05-15 NOTE — Assessment & Plan Note (Addendum)
Td 2011 Flu shot today Diet and exercise discussed Patient is not fasting today, will restart losartan (see hypertension) and check labs in 2-3 weeks. +FH CAD,advised pt is extremely important to get BP  and chol well

## 2013-05-15 NOTE — Patient Instructions (Signed)
Restart losartan 1 tablet daily Check the  blood pressure 2 or 3 times a week, be sure it is between 110/60 and 140/85. If it is consistently higher or lower, let me know --- Come back fasting in 2 or 3 weeks: FLP, CMP, CBC, TSH-- dx v70 ---  Next visit in 6 months  Sodium-Controlled Diet Sodium is a mineral. It is found in many foods. Sodium may be found naturally or added during the making of a food. The most common form of sodium is salt, which is made up of sodium and chloride. Reducing your sodium intake involves changing your eating habits. The following guidelines will help you reduce the sodium in your diet:  Stop using the salt shaker.  Use salt sparingly in cooking and baking.  Substitute with sodium-free seasonings and spices.  Do not use a salt substitute (potassium chloride) without your caregiver's permission.  Include a variety of fresh, unprocessed foods in your diet.  Limit the use of processed and convenience foods that are high in sodium. USE THE FOLLOWING FOODS SPARINGLY: Breads/Starches  Commercial bread stuffing, commercial pancake or waffle mixes, coating mixes. Waffles. Croutons. Prepared (boxed or frozen) potato, rice, or noodle mixes that contain salt or sodium. Salted [place] fries or hash browns. Salted popcorn, breads, crackers, chips, or snack foods. Vegetables  Vegetables canned with salt or prepared in cream, butter, or cheese sauces. Sauerkraut. Tomato or vegetable juices canned with salt.  Fresh vegetables are allowed if rinsed thoroughly. Fruit  Fruit is okay to eat. Meat and Meat Substitutes  Salted or smoked meats, such as bacon or Canadian bacon, chipped or corned beef, hot dogs, salt pork, luncheon meats, pastrami, ham, or sausage. Canned or smoked fish, poultry, or meat. Processed cheese or cheese spreads, blue or Roquefort cheese. Battered or frozen fish products. Prepared spaghetti sauce. Baked beans. Reuben sandwiches. Salted nuts.  Caviar. Milk  Limit buttermilk to 1 cup per week. Soups and Combination Foods  Bouillon cubes, canned or dried soups, broth, consomm. Convenience (frozen or packaged) dinners with more than 600 mg sodium. Pot pies, pizza, Asian food, fast food cheeseburgers, and specialty sandwiches. Desserts and Sweets  Regular (salted) desserts, pie, commercial fruit snack pies, commercial snack cakes, canned puddings.  Eat desserts and sweets in moderation. Fats and Oils  Gravy mixes or canned gravy. No more than 1 to 2 tbs of salad dressing. Chip dips.  Eat fats and oils in moderation. Beverages  See those listed under the vegetables and milk groups. Condiments  Ketchup, mustard, meat sauces, salsa, regular (salted) and lite soy sauce or mustard. Dill pickles, olives, meat tenderizer. Prepared horseradish or pickle relish. Dutch-processed cocoa. Baking powder or baking soda used medicinally. Worcestershire sauce. "Light" salt. Salt substitute, unless approved by your caregiver. Document Released: 01/15/2002 Document Revised: 10/18/2011 Document Reviewed: 08/18/2009 [facility] Patient Information 2014 [place], [place].

## 2013-06-05 ENCOUNTER — Telehealth: Payer: Self-pay | Admitting: Internal Medicine

## 2013-06-05 DIAGNOSIS — Z0000 Encounter for general adult medical examination without abnormal findings: Secondary | ICD-10-CM

## 2013-06-05 NOTE — Telephone Encounter (Signed)
Advise patient: He is due for fasting labs: FLP, CMP, CBC, TSH-- dx v70 Please arrange

## 2013-06-06 NOTE — Telephone Encounter (Signed)
Pt scheduled 06/11/13. Labs ordered. DJR

## 2013-06-11 ENCOUNTER — Other Ambulatory Visit: Payer: 59

## 2013-06-12 ENCOUNTER — Other Ambulatory Visit (INDEPENDENT_AMBULATORY_CARE_PROVIDER_SITE_OTHER): Payer: 59

## 2013-06-12 ENCOUNTER — Other Ambulatory Visit: Payer: Self-pay | Admitting: *Deleted

## 2013-06-12 ENCOUNTER — Telehealth: Payer: Self-pay | Admitting: *Deleted

## 2013-06-12 DIAGNOSIS — Z0000 Encounter for general adult medical examination without abnormal findings: Secondary | ICD-10-CM

## 2013-06-12 LAB — CBC
Hemoglobin: 15.6 g/dL (ref 13.0–17.0)
MCHC: 34.3 g/dL (ref 30.0–36.0)
Platelets: 225 10*3/uL (ref 150.0–400.0)
RBC: 5.17 Mil/uL (ref 4.22–5.81)
WBC: 6.3 10*3/uL (ref 4.5–10.5)

## 2013-06-12 LAB — COMPREHENSIVE METABOLIC PANEL
ALT: 33 U/L (ref 0–53)
BUN: 14 mg/dL (ref 6–23)
CO2: 24 mEq/L (ref 19–32)
Creatinine, Ser: 1 mg/dL (ref 0.4–1.5)
GFR: 83.79 mL/min (ref >60.00)
Total Bilirubin: 0.5 mg/dL (ref 0.3–1.2)

## 2013-06-12 LAB — HEPATIC FUNCTION PANEL
AST: 27 U/L (ref 0–37)
Albumin: 4.2 g/dL (ref 3.5–5.2)
Alkaline Phosphatase: 75 U/L (ref 39–117)
Total Protein: 7.2 g/dL (ref 6.0–8.3)

## 2013-06-12 MED ORDER — LOSARTAN POTASSIUM 50 MG PO TABS: 50.0000 mg | ORAL_TABLET | Freq: Every day | ORAL | Status: DC

## 2013-06-12 MED ORDER — ESOMEPRAZOLE MAGNESIUM 40 MG PO CPDR: DELAYED_RELEASE_CAPSULE | ORAL | Status: DC

## 2013-06-12 NOTE — Telephone Encounter (Signed)
Losartan and Nexium refills sent to pharmacy

## 2013-06-12 NOTE — Telephone Encounter (Signed)
Done. DJR  

## 2013-06-12 NOTE — Telephone Encounter (Signed)
06/12/2013  Pt came in this morning for his fasting blood work.  He has aprox a week left before needing losartan (COZAAR) 50 MG tablet for blood pressure refilled.  Please send to [facility] on [address].  He also asked about a prescription for NEXIUM 40 MG capsule.  Patient is aware he can get OTC, but would like a generic if possible due to copay being cheaper.  Please advise patient about this and when refills have been sent to pharmacy.   Thank you.  bw

## 2013-06-18 ENCOUNTER — Ambulatory Visit: Payer: 59

## 2013-06-18 DIAGNOSIS — Z0000 Encounter for general adult medical examination without abnormal findings: Secondary | ICD-10-CM

## 2013-06-18 DIAGNOSIS — E785 Hyperlipidemia, unspecified: Secondary | ICD-10-CM

## 2013-06-18 LAB — LIPID PANEL
HDL: 45.6 mg/dL (ref >39.00)
Total CHOL/HDL Ratio: 4
Triglycerides: 144 mg/dL (ref 0.0–149.0)
VLDL: 28.8 mg/dL (ref 0.0–40.0)

## 2013-06-18 LAB — LDL CHOLESTEROL, DIRECT: Direct LDL: 137.9 mg/dL

## 2013-06-25 ENCOUNTER — Encounter: Payer: Self-pay | Admitting: Internal Medicine

## 2013-06-25 ENCOUNTER — Ambulatory Visit (INDEPENDENT_AMBULATORY_CARE_PROVIDER_SITE_OTHER): Payer: 59 | Admitting: Internal Medicine

## 2013-06-25 VITALS — BP 120/68 | HR 97 | Temp 97.9°F | Wt 212.0 lb

## 2013-06-25 DIAGNOSIS — H538 Other visual disturbances: Secondary | ICD-10-CM | POA: Insufficient documentation
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
## 2013-06-25 NOTE — Patient Instructions (Signed)
See your eye doctor within a week, call if you ned a referral, call if symptoms increase

## 2013-06-25 NOTE — Assessment & Plan Note (Signed)
Episodic visual disturbances for 3 days,  symptoms not consistent with a neurologic syndrome, neurological exam normal, Funduscopy is limited to but seems negative. At this point, I recommend to see his eye doctor and encouraged to call me if he's not improving. Will call me if needs a referral.

## 2013-06-26 ENCOUNTER — Encounter: Payer: Self-pay | Admitting: Internal Medicine

## 2013-07-14 ENCOUNTER — Telehealth: Payer: Self-pay | Admitting: Internal Medicine

## 2013-07-14 NOTE — Telephone Encounter (Signed)
Please call the patient, was recently seen with visual disturbances, did he get to see the eye doctor? Doing better ?

## 2013-07-16 NOTE — Telephone Encounter (Signed)
thx

## 2013-07-16 NOTE — Telephone Encounter (Signed)
Pt states feels better, went to see the eye doctor, found nothing wrong / everything was normal. DJR

## 2013-08-22 ENCOUNTER — Telehealth: Payer: Self-pay | Admitting: *Deleted

## 2013-08-22 NOTE — Telephone Encounter (Signed)
PA paperwork for Nexium faxed to insurance company. Awaiting response. JG//[id number]

## 2013-08-24 NOTE — Telephone Encounter (Signed)
PA denied for Nexium.

## 2013-10-03 ENCOUNTER — Telehealth: Payer: Self-pay | Admitting: Internal Medicine

## 2013-10-03 DIAGNOSIS — K219 Gastro-esophageal reflux disease without esophagitis: Secondary | ICD-10-CM

## 2013-10-03 MED ORDER — DEXLANSOPRAZOLE 60 MG PO CPDR: 60.0000 mg | DELAYED_RELEASE_CAPSULE | Freq: Every day | ORAL | Status: DC

## 2013-10-03 NOTE — Telephone Encounter (Signed)
Done . Pt notified.  

## 2013-10-03 NOTE — Telephone Encounter (Signed)
Patient's wife called stating their insurance will cover dexilant. It was recommended that he take 60 mg to equal the dosage of nexium he was taking. Pt uses [facility] on [address].

## 2013-10-03 NOTE — Telephone Encounter (Signed)
Discontinue Nexium and Dexilant 60 mg one by mouth daily, #90 and 3 refill

## 2013-10-03 NOTE — Addendum Note (Signed)
Addended by: Peggyann Shoals on: 10/03/2013 04:59 PM   Modules accepted: Orders, Medications

## 2014-03-10 ENCOUNTER — Other Ambulatory Visit: Payer: Self-pay | Admitting: Internal Medicine

## 2014-10-18 ENCOUNTER — Other Ambulatory Visit: Payer: Self-pay | Admitting: Internal Medicine

## 2014-11-15 ENCOUNTER — Telehealth: Payer: Self-pay | Admitting: Internal Medicine

## 2014-11-15 DIAGNOSIS — K219 Gastro-esophageal reflux disease without esophagitis: Secondary | ICD-10-CM

## 2014-11-15 MED ORDER — DEXLANSOPRAZOLE 60 MG PO CPDR: 60.0000 mg | DELAYED_RELEASE_CAPSULE | Freq: Every day | ORAL | Status: DC

## 2014-11-15 MED ORDER — LOSARTAN POTASSIUM 50 MG PO TABS: 50.0000 mg | ORAL_TABLET | Freq: Every day | ORAL | Status: DC

## 2014-11-15 NOTE — Telephone Encounter (Signed)
Caller name: Maudie Mercury Relation to pt: wife Call back number: [phone] Pharmacy: [facility] on [address]  Reason for call:   Patient has cpe scheduled for 11/22/14. Requesting enough of  Dexilant and losartan refill to last him until his appointment. He is out of both.

## 2014-11-15 NOTE — Telephone Encounter (Signed)
Routing error, please see below.

## 2014-11-15 NOTE — Telephone Encounter (Signed)
I'm sorry 15 day supply only until seen. Pt has not been seen since 06/2013. At appt we can increase to 30 day supply, however for now 15 days.

## 2014-11-15 NOTE — Telephone Encounter (Signed)
Patient wife returned phone call. She is requesting that [facility] call her back today.

## 2014-11-15 NOTE — Telephone Encounter (Signed)
15 day supply given on both meds, Pt MUST keep appt.

## 2014-11-15 NOTE — Telephone Encounter (Signed)
Patient wife calling back stating that her insurance pays better at 30 day.(she pays same amt for 15 days and 30 days). She would like to know if these could be called in for a 30 day supply. She states that patient will keep appt.

## 2014-11-18 ENCOUNTER — Other Ambulatory Visit: Payer: Self-pay

## 2014-11-18 DIAGNOSIS — K219 Gastro-esophageal reflux disease without esophagitis: Secondary | ICD-10-CM

## 2014-11-18 MED ORDER — LOSARTAN POTASSIUM 50 MG PO TABS: 50.0000 mg | ORAL_TABLET | Freq: Every day | ORAL | Status: DC

## 2014-11-18 MED ORDER — DEXLANSOPRAZOLE 60 MG PO CPDR: 60.0000 mg | DELAYED_RELEASE_CAPSULE | Freq: Every day | ORAL | Status: DC

## 2014-11-18 NOTE — Telephone Encounter (Signed)
30 day supply sent to [facility].

## 2014-11-22 ENCOUNTER — Encounter: Payer: Self-pay | Admitting: Internal Medicine

## 2014-11-22 ENCOUNTER — Telehealth: Payer: Self-pay | Admitting: Internal Medicine

## 2014-11-22 NOTE — Telephone Encounter (Signed)
Pre Visit letter sent  °

## 2014-12-10 ENCOUNTER — Telehealth: Payer: Self-pay | Admitting: *Deleted

## 2014-12-10 ENCOUNTER — Encounter: Payer: Self-pay | Admitting: *Deleted

## 2014-12-10 NOTE — Telephone Encounter (Signed)
Pre-Visit Call completed with patient and chart updated.   Pre-Visit Info documented in Specialty Comments under [employer].    

## 2014-12-11 ENCOUNTER — Ambulatory Visit (INDEPENDENT_AMBULATORY_CARE_PROVIDER_SITE_OTHER): Payer: 59 | Admitting: Internal Medicine

## 2014-12-11 ENCOUNTER — Encounter: Payer: Self-pay | Admitting: Internal Medicine

## 2014-12-11 ENCOUNTER — Other Ambulatory Visit: Payer: Self-pay

## 2014-12-11 VITALS — BP 126/86 | HR 92 | Temp 97.9°F | Ht 71.0 in | Wt 205.4 lb

## 2014-12-11 DIAGNOSIS — I10 Essential (primary) hypertension: Secondary | ICD-10-CM

## 2014-12-11 DIAGNOSIS — K219 Gastro-esophageal reflux disease without esophagitis: Secondary | ICD-10-CM

## 2014-12-11 DIAGNOSIS — F528 Other sexual dysfunction not due to a substance or known physiological condition: Secondary | ICD-10-CM

## 2014-12-11 DIAGNOSIS — Z0000 Encounter for general adult medical examination without abnormal findings: Secondary | ICD-10-CM

## 2014-12-11 MED ORDER — LOSARTAN POTASSIUM 50 MG PO TABS: 50.0000 mg | ORAL_TABLET | Freq: Every day | ORAL | Status: DC

## 2014-12-11 MED ORDER — DEXLANSOPRAZOLE 60 MG PO CPDR: 60.0000 mg | DELAYED_RELEASE_CAPSULE | Freq: Every day | ORAL | Status: DC

## 2014-12-11 NOTE — Assessment & Plan Note (Addendum)
Td 2011   Diet and exercise discussed  Labs -- Patient is not fasting, will check a total cholesterol and get a FLP if needed.

## 2014-12-11 NOTE — Patient Instructions (Addendum)
Get your blood work before you leave    Check the  blood pressure 2 or 3 times a month  Be sure your blood pressure is between 110/65 and  145/85.  if it is consistently higher or lower, let me know    Take Dexilant every other day only, if the acid reflux symptoms come back---  let me know   Come back to the office in 1 year   for a physical exam  Please schedule an appointment at the front desk    Come back fasting
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
## 2014-12-11 NOTE — Assessment & Plan Note (Signed)
Intolerant to Viagra, not taking

## 2014-12-11 NOTE — Assessment & Plan Note (Signed)
Symptoms well-controlled with daily PPIs, recommend to decrease to every other day

## 2014-12-11 NOTE — Progress Notes (Signed)
Pre visit review using our clinic review tool, if applicable. No additional management support is needed unless otherwise documented below in the visit note. 

## 2014-12-11 NOTE — Assessment & Plan Note (Signed)
Controlled, refill meds, labs

## 2014-12-12 LAB — CBC WITH DIFFERENTIAL/PLATELET
BASOS ABS: 0 10*3/uL (ref 0.0–0.1)
Basophils Relative: 0.3 % (ref 0.0–3.0)
EOS ABS: 0 10*3/uL (ref 0.0–0.7)
Eosinophils Relative: 0.4 % (ref 0.0–5.0)
HEMATOCRIT: 43.9 % (ref 39.0–52.0)
HEMOGLOBIN: 15.1 g/dL (ref 13.0–17.0)
LYMPHS PCT: 30 % (ref 12.0–46.0)
Lymphs Abs: 2.4 10*3/uL (ref 0.7–4.0)
MCHC: 34.3 g/dL (ref 30.0–36.0)
MCV: 88.1 fl (ref 78.0–100.0)
MONOS PCT: 9 % (ref 3.0–12.0)
Monocytes Absolute: 0.7 10*3/uL (ref 0.1–1.0)
NEUTROS ABS: 4.8 10*3/uL (ref 1.4–7.7)
Neutrophils Relative %: 60.3 % (ref 43.0–77.0)
Platelets: 217 10*3/uL (ref 150.0–400.0)
RBC: 4.98 Mil/uL (ref 4.22–5.81)
RDW: 12.9 % (ref 11.5–15.5)
WBC: 8 10*3/uL (ref 4.0–10.5)

## 2014-12-12 LAB — COMPREHENSIVE METABOLIC PANEL
ALT: 30 U/L (ref 0–53)
AST: 23 U/L (ref 0–37)
Albumin: 4.3 g/dL (ref 3.5–5.2)
Alkaline Phosphatase: 74 U/L (ref 39–117)
BILIRUBIN TOTAL: 0.4 mg/dL (ref 0.2–1.2)
BUN: 12 mg/dL (ref 6–23)
CO2: 26 meq/L (ref 19–32)
CREATININE: 1.16 mg/dL (ref 0.40–1.50)
Calcium: 9.5 mg/dL (ref 8.4–10.5)
Chloride: 103 mEq/L (ref 96–112)
GFR: 70.97 mL/min (ref >60.00)
GLUCOSE: 120 mg/dL — AB (ref 70–99)
Potassium: 3.6 mEq/L (ref 3.5–5.1)
Sodium: 136 mEq/L (ref 135–145)
Total Protein: 7.4 g/dL (ref 6.0–8.3)

## 2014-12-12 LAB — TSH: TSH: 2.12 u[IU]/mL (ref 0.35–4.50)

## 2014-12-12 LAB — CHOLESTEROL, TOTAL: Cholesterol: 189 mg/dL (ref 0–200)

## 2014-12-12 LAB — HIV ANTIBODY (ROUTINE TESTING W REFLEX): HIV 1&2 Ab, 4th Generation: NONREACTIVE

## 2015-04-24 ENCOUNTER — Ambulatory Visit (INDEPENDENT_AMBULATORY_CARE_PROVIDER_SITE_OTHER): Payer: 59

## 2015-04-24 ENCOUNTER — Ambulatory Visit (INDEPENDENT_AMBULATORY_CARE_PROVIDER_SITE_OTHER): Payer: 59 | Admitting: Podiatry

## 2015-04-24 ENCOUNTER — Encounter: Payer: Self-pay | Admitting: Podiatry

## 2015-04-24 VITALS — BP 138/93 | HR 85 | Resp 16 | Ht 72.0 in | Wt 205.0 lb

## 2015-04-24 DIAGNOSIS — M779 Enthesopathy, unspecified: Secondary | ICD-10-CM | POA: Diagnosis not present

## 2015-04-24 DIAGNOSIS — M722 Plantar fascial fibromatosis: Secondary | ICD-10-CM | POA: Diagnosis not present

## 2015-04-24 DIAGNOSIS — M79672 Pain in left foot: Secondary | ICD-10-CM | POA: Diagnosis not present

## 2015-04-24 DIAGNOSIS — M79671 Pain in right foot: Secondary | ICD-10-CM

## 2015-04-24 MED ORDER — MELOXICAM 15 MG PO TABS: 15.0000 mg | ORAL_TABLET | Freq: Every day | ORAL | Status: DC

## 2015-04-24 MED ORDER — TRIAMCINOLONE ACETONIDE 10 MG/ML IJ SUSP
10.0000 mg | Freq: Once | INTRAMUSCULAR | Status: AC
  Administered 2015-04-24: 10 mg

## 2015-04-24 NOTE — Progress Notes (Signed)
   Subjective:    Patient ID: Danny Henderson, male    DOB: 09-Jan-1966, 49 y.o.   MRN: [id number]  HPI Patient presents with bilateral foot pain. On Right foot-below ankle and heel; on Left foot-below ankle and plantar forefoot; x10 years. Pt stated, "right foot hurts more". Soaked in epsom salt with some relief.   Review of Systems  All other systems reviewed and are negative.      Objective:   Physical Exam        Assessment & Plan:

## 2015-04-24 NOTE — Progress Notes (Signed)
Subjective:     Patient ID: Danny Henderson, male   DOB: 04-21-1966, 49 y.o.   MRN: [id number]  HPI patient presents with pain in the plantar aspect of the right heel of approximate 6 months duration and pain in the right ankle of approximate 6 month duration. States that the left bothers him also but not to the same degree and that it has become more difficult and especially when he is active on his foot or trying to walk   Review of Systems  All other systems reviewed and are negative.      Objective:   Physical Exam  Constitutional: He is oriented to person, place, and time.  Cardiovascular: Intact distal pulses.   Musculoskeletal: Normal range of motion.  Neurological: He is oriented to person, place, and time.  Skin: Skin is warm.  Nursing note and vitals reviewed.  neurovascular status intact muscle strength adequate with range of motion within normal limits. Patient's noted to have good digital perfusion is well oriented 3 and is noted to have discomfort in the plantar aspect of the right heel at the insertional point of the tendon into the calcaneus and is also noted to have pain in the right ankle in the sinus tarsi with inflammation and fluid buildup noted patient has good digital perfusion and is well oriented 3     Assessment:     Inflammatory plantar fasciitis right along with sinus tarsitis right with fluid buildup around the sinus tarsi    Plan:     H&P and x-rays reviewed with patient. Today I went ahead and injected the plantar fascia right 3 mg Kenalog 5 mill grams Xylocaine and the capsule of the sinus tarsi right 3 mg Kenalog 5 mg Xylocaine dispensed fascial brace and gave instructions on physical therapy. Reappoint to recheck again in 2 weeks and discussed long-term orthotics for this patient

## 2015-04-24 NOTE — Patient Instructions (Signed)

## 2015-05-02 ENCOUNTER — Encounter: Payer: Self-pay | Admitting: Internal Medicine

## 2015-05-08 ENCOUNTER — Ambulatory Visit (INDEPENDENT_AMBULATORY_CARE_PROVIDER_SITE_OTHER): Payer: 59 | Admitting: Podiatry

## 2015-05-08 ENCOUNTER — Encounter: Payer: Self-pay | Admitting: Podiatry

## 2015-05-08 VITALS — BP 125/89 | HR 85 | Resp 16

## 2015-05-08 DIAGNOSIS — M722 Plantar fascial fibromatosis: Secondary | ICD-10-CM | POA: Diagnosis not present

## 2015-05-08 DIAGNOSIS — M79671 Pain in right foot: Secondary | ICD-10-CM | POA: Diagnosis not present

## 2015-05-08 DIAGNOSIS — M79672 Pain in left foot: Secondary | ICD-10-CM | POA: Diagnosis not present

## 2015-05-08 DIAGNOSIS — M779 Enthesopathy, unspecified: Secondary | ICD-10-CM | POA: Diagnosis not present

## 2015-05-08 NOTE — Progress Notes (Signed)
Subjective:     Patient ID: Danny Henderson, male   DOB: [birth year], 49 y.o.   MRN: [id number]  HPI patient states my foot feels better but I do walk 10-15 miles a day and I'm still having pain during the day   Review of Systems     Objective:   Physical Exam Neurovascular status intact discomfort in the plantar aspect of the right heel with inflammation and fluid buildup noted around the medial band with moderate depression of the arch    Assessment:     Plantar fasciitis right that has improved but still present along with depression of arch    Plan:     Advised on physical therapy and at this time I scanned for custom orthotics to reduce stress against the heel and arch. Patient will be seen back when those are ready

## 2015-05-30 ENCOUNTER — Ambulatory Visit: Payer: 59 | Admitting: *Deleted

## 2015-05-30 DIAGNOSIS — M722 Plantar fascial fibromatosis: Secondary | ICD-10-CM

## 2015-05-30 NOTE — Progress Notes (Signed)
Patient ID: Danny Henderson, male   DOB: 03/20/1966, 49 y.o.   MRN: [id number] Patient presents for orthotic pick up.  Verbal and written break in and wear instructions given.  Patient will follow up in 4 weeks if symptoms worsen or fail to improve.

## 2015-05-30 NOTE — Patient Instructions (Signed)

## 2015-06-27 ENCOUNTER — Encounter: Payer: Self-pay | Admitting: Podiatry

## 2015-06-27 ENCOUNTER — Ambulatory Visit (INDEPENDENT_AMBULATORY_CARE_PROVIDER_SITE_OTHER): Payer: 59 | Admitting: Podiatry

## 2015-06-27 VITALS — BP 131/95 | HR 84 | Resp 16

## 2015-06-27 DIAGNOSIS — M722 Plantar fascial fibromatosis: Secondary | ICD-10-CM | POA: Diagnosis not present

## 2015-06-28 NOTE — Progress Notes (Signed)
Subjective:     Patient ID: Danny Henderson, male   DOB: 1965/10/31, 49 y.o.   MRN: [id number]  HPI patient presents stating I am doing much better with continued discomfort in my heel with prolonged walking and I do wear the orthotics a lot as I do walk 12-14 hours a day   Review of Systems     Objective:   Physical Exam Neurovascular status intact muscle strength adequate with significant diminishment of discomfort plantar aspect right heel at the insertion of the tendon into the calcaneus with diminished fluid buildup noted    Assessment:     Improving plantar fasciitis with discomfort still present upon deep palpation    Plan:     Advised on the importance of physical therapy proper shoe gear usage and we'll make a second pair of orthotics as he has to wear these for such extended periods of time. Patient will be seen back to recheck

## 2015-12-30 ENCOUNTER — Other Ambulatory Visit: Payer: Self-pay

## 2015-12-30 DIAGNOSIS — K219 Gastro-esophageal reflux disease without esophagitis: Secondary | ICD-10-CM

## 2015-12-30 MED ORDER — DEXLANSOPRAZOLE 60 MG PO CPDR: 60.0000 mg | DELAYED_RELEASE_CAPSULE | Freq: Every day | ORAL | Status: DC

## 2016-02-04 ENCOUNTER — Telehealth: Payer: Self-pay | Admitting: Internal Medicine

## 2016-02-04 MED ORDER — LOSARTAN POTASSIUM 50 MG PO TABS: 50.0000 mg | ORAL_TABLET | Freq: Every day | ORAL | Status: DC

## 2016-02-04 NOTE — Telephone Encounter (Signed)
Rx sent 

## 2016-02-04 NOTE — Telephone Encounter (Addendum)
Caller Name: Shahrukh Horrocks  Relation to [phone]  Call back number:[phone] Pharmacy: [facility], [place]   Reason for call:  Spouse called requesting blood pressure medication (spouse doesn't know the name of Rx). Please advise

## 2016-02-04 NOTE — Telephone Encounter (Signed)
30 day supply of Losartan sent to [facility], Pt has not been seen since 12/11/2014. No further refills w/o appt. Please have Pt or his wife schedule appt. Thank you.

## 2016-02-04 NOTE — Telephone Encounter (Signed)
Ok to Rf enough till next OV

## 2016-02-04 NOTE — Telephone Encounter (Signed)
Please advise. Does Pt need to come in for labs?

## 2016-02-04 NOTE — Telephone Encounter (Signed)
Patient scheduled physical for 03/11/16 and would like refill to hold him over until then. Please advise

## 2016-02-19 ENCOUNTER — Telehealth: Payer: Self-pay | Admitting: Internal Medicine

## 2016-02-19 DIAGNOSIS — K219 Gastro-esophageal reflux disease without esophagitis: Secondary | ICD-10-CM

## 2016-02-19 MED ORDER — DEXLANSOPRAZOLE 60 MG PO CPDR: 60.0000 mg | DELAYED_RELEASE_CAPSULE | Freq: Every day | ORAL | Status: DC

## 2016-02-19 NOTE — Telephone Encounter (Signed)
Pt has not been seen by Dr. Larose Kells since 12/2014. CPE scheduled for 03/11/2016, 30 day supply sent.

## 2016-02-19 NOTE — Telephone Encounter (Signed)
Pt called in to request a refill on his dexlansoprazole Rx.    [facility]: [facility] [place], [place]

## 2016-03-11 ENCOUNTER — Ambulatory Visit (INDEPENDENT_AMBULATORY_CARE_PROVIDER_SITE_OTHER): Payer: 59 | Admitting: Internal Medicine

## 2016-03-11 ENCOUNTER — Encounter: Payer: Self-pay | Admitting: Internal Medicine

## 2016-03-11 VITALS — BP 118/80 | HR 79 | Temp 97.9°F | Resp 14 | Ht 72.0 in | Wt 202.4 lb

## 2016-03-11 DIAGNOSIS — R0683 Snoring: Secondary | ICD-10-CM

## 2016-03-11 DIAGNOSIS — Z0000 Encounter for general adult medical examination without abnormal findings: Secondary | ICD-10-CM | POA: Diagnosis not present

## 2016-03-11 DIAGNOSIS — Z09 Encounter for follow-up examination after completed treatment for conditions other than malignant neoplasm: Secondary | ICD-10-CM | POA: Insufficient documentation

## 2016-03-11 DIAGNOSIS — G4730 Sleep apnea, unspecified: Secondary | ICD-10-CM

## 2016-03-11 DIAGNOSIS — K219 Gastro-esophageal reflux disease without esophagitis: Secondary | ICD-10-CM

## 2016-03-11 LAB — CBC WITH DIFFERENTIAL/PLATELET
BASOS ABS: 0 10*3/uL (ref 0.0–0.1)
Basophils Relative: 0.3 % (ref 0.0–3.0)
EOS ABS: 0 10*3/uL (ref 0.0–0.7)
Eosinophils Relative: 0.7 % (ref 0.0–5.0)
HEMATOCRIT: 41.8 % (ref 39.0–52.0)
Hemoglobin: 14.1 g/dL (ref 13.0–17.0)
LYMPHS ABS: 2.1 10*3/uL (ref 0.7–4.0)
LYMPHS PCT: 28.8 % (ref 12.0–46.0)
MCHC: 33.7 g/dL (ref 30.0–36.0)
MCV: 88.2 fl (ref 78.0–100.0)
MONO ABS: 0.9 10*3/uL (ref 0.1–1.0)
Monocytes Relative: 12.8 % — ABNORMAL HIGH (ref 3.0–12.0)
NEUTROS ABS: 4.1 10*3/uL (ref 1.4–7.7)
NEUTROS PCT: 57.4 % (ref 43.0–77.0)
PLATELETS: 208 10*3/uL (ref 150.0–400.0)
RBC: 4.74 Mil/uL (ref 4.22–5.81)
RDW: 13.5 % (ref 11.5–15.5)
WBC: 7.1 10*3/uL (ref 4.0–10.5)

## 2016-03-11 LAB — LIPID PANEL
CHOLESTEROL: 180 mg/dL (ref 0–200)
HDL: 56.7 mg/dL (ref >39.00)
LDL Cholesterol: 107 mg/dL — ABNORMAL HIGH (ref 0–99)
NonHDL: 122.9
TRIGLYCERIDES: 80 mg/dL (ref 0.0–149.0)
Total CHOL/HDL Ratio: 3
VLDL: 16 mg/dL (ref 0.0–40.0)

## 2016-03-11 LAB — HEMOGLOBIN A1C: HEMOGLOBIN A1C: 5.9 % (ref 4.6–6.5)

## 2016-03-11 LAB — PSA: PSA: 0.22 ng/mL (ref 0.10–4.00)

## 2016-03-11 LAB — COMPREHENSIVE METABOLIC PANEL
ALT: 25 U/L (ref 0–53)
AST: 19 U/L (ref 0–37)
Albumin: 4.1 g/dL (ref 3.5–5.2)
Alkaline Phosphatase: 65 U/L (ref 39–117)
BILIRUBIN TOTAL: 0.5 mg/dL (ref 0.2–1.2)
BUN: 12 mg/dL (ref 6–23)
CALCIUM: 9.3 mg/dL (ref 8.4–10.5)
CO2: 29 meq/L (ref 19–32)
CREATININE: 1.05 mg/dL (ref 0.40–1.50)
Chloride: 106 mEq/L (ref 96–112)
GFR: 79.22 mL/min (ref >60.00)
Glucose, Bld: 89 mg/dL (ref 70–99)
Potassium: 4.4 mEq/L (ref 3.5–5.1)
SODIUM: 140 meq/L (ref 135–145)
Total Protein: 6.6 g/dL (ref 6.0–8.3)

## 2016-03-11 LAB — TSH: TSH: 1.72 u[IU]/mL (ref 0.35–4.50)

## 2016-03-11 MED ORDER — DEXLANSOPRAZOLE 60 MG PO CPDR: 60.0000 mg | DELAYED_RELEASE_CAPSULE | Freq: Every day | ORAL | 12 refills | Status: DC

## 2016-03-11 MED ORDER — LOSARTAN POTASSIUM 50 MG PO TABS: 50.0000 mg | ORAL_TABLET | Freq: Every day | ORAL | 12 refills | Status: DC

## 2016-03-11 NOTE — Assessment & Plan Note (Signed)
HTN: Continue losartan, BP is very good GERD: Taking PPIs every other day. sx controlled Snoring: Severe, episodes of apnea: [place]  Scale 17! Refer to neurology for further eval Paresthesias , lower extremities as described above: Vascular exam normal, DTRs normal. Check  [id number] folic acid. Otherwise observation RTC one year
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
## 2016-03-11 NOTE — Assessment & Plan Note (Addendum)
Td 2011  Colon cancer screening: 3 Modalities discussed, elected IFOB Prostate cancer screening: DRE today normal, check a PSA Diet and exercise discussed + FH CAD: Controlling BPs, checking a cholesterol panel. She is doing well with lifestyle Labs : CMP, CBC, FLP, TSH, 123456, 123456, folic acid., PSA

## 2016-03-11 NOTE — Patient Instructions (Signed)
GO TO THE LAB : Get the blood work     GO TO THE FRONT DESK Schedule your next appointment for a  Physical exam in 1 year   Check the  blood pressure  Monthly  Be sure your blood pressure is between 110/65 and  145/85. If it is consistently higher or lower, let me know

## 2016-03-11 NOTE — Progress Notes (Signed)
Pre visit review using our clinic review tool, if applicable. No additional management support is needed unless otherwise documented below in the visit note. 

## 2016-03-15 NOTE — Addendum Note (Signed)
Addended byDamita Dunnings D on: 03/15/2016 04:48 PM   Modules accepted: Orders

## 2016-03-18 ENCOUNTER — Ambulatory Visit (INDEPENDENT_AMBULATORY_CARE_PROVIDER_SITE_OTHER): Payer: 59 | Admitting: Neurology

## 2016-03-18 ENCOUNTER — Encounter: Payer: Self-pay | Admitting: Neurology

## 2016-03-18 VITALS — BP 122/82 | HR 80 | Resp 20 | Ht 72.0 in | Wt 197.0 lb

## 2016-03-18 DIAGNOSIS — M2619 Other specified anomalies of jaw-cranial base relationship: Secondary | ICD-10-CM | POA: Diagnosis not present

## 2016-03-18 DIAGNOSIS — G4710 Hypersomnia, unspecified: Secondary | ICD-10-CM | POA: Diagnosis not present

## 2016-03-18 DIAGNOSIS — G4730 Sleep apnea, unspecified: Secondary | ICD-10-CM

## 2016-03-18 DIAGNOSIS — R0683 Snoring: Secondary | ICD-10-CM | POA: Diagnosis not present
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
## 2016-03-18 NOTE — Patient Instructions (Signed)
Please remember to try to maintain good sleep hygiene, which means: Keep a regular sleep and wake schedule, try not to exercise or have a meal within 2 hours of your bedtime, try to keep your bedroom conducive for sleep, that is, cool and dark, without light distractors such as an illuminated alarm clock, and refrain from watching TV right before sleep or in the middle of the night and do not keep the TV or radio on during the night. Also, try not to use or play on electronic devices at bedtime, such as your cell phone, tablet PC or laptop. If you like to read at bedtime on an electronic device, try to dim the background light as much as possible. Do not eat in the middle of the night.   We will request a sleep study.

## 2016-05-06 ENCOUNTER — Encounter (INDEPENDENT_AMBULATORY_CARE_PROVIDER_SITE_OTHER): Payer: 59 | Admitting: Neurology

## 2016-05-06 DIAGNOSIS — M2619 Other specified anomalies of jaw-cranial base relationship: Secondary | ICD-10-CM

## 2016-05-06 DIAGNOSIS — G4710 Hypersomnia, unspecified: Secondary | ICD-10-CM

## 2016-05-06 DIAGNOSIS — G4730 Sleep apnea, unspecified: Secondary | ICD-10-CM

## 2016-05-06 DIAGNOSIS — R0683 Snoring: Secondary | ICD-10-CM

## 2016-05-13 ENCOUNTER — Telehealth: Payer: Self-pay

## 2016-05-13 NOTE — Telephone Encounter (Signed)
I spoke to pt. I advised him that his sleep study results revealed mild sleep apnea without prolonged hypoxemia. Dr. Brett Fairy says that cpap is optional and snoring can be treated with a dental device. I offered a dental referral or a follow up with Dr. Brett Fairy. Pt says that he would prefer a follow up with Dr. Brett Fairy. An appt was made for 06/10/2016 at 10:30. Pt verbalized understanding of results. Pt had no questions at this time but was encouraged to call back if questions arise.

## 2016-06-10 ENCOUNTER — Encounter: Payer: Self-pay | Admitting: Neurology

## 2016-06-10 ENCOUNTER — Ambulatory Visit (INDEPENDENT_AMBULATORY_CARE_PROVIDER_SITE_OTHER): Payer: 59 | Admitting: Neurology

## 2016-06-10 VITALS — BP 126/90 | HR 92 | Resp 20 | Wt 206.5 lb

## 2016-06-10 DIAGNOSIS — G4733 Obstructive sleep apnea (adult) (pediatric): Secondary | ICD-10-CM

## 2016-06-10 DIAGNOSIS — G4719 Other hypersomnia: Secondary | ICD-10-CM | POA: Diagnosis not present

## 2016-06-10 MED ORDER — MODAFINIL 200 MG PO TABS: 200.0000 mg | ORAL_TABLET | Freq: Every day | ORAL | 5 refills | Status: DC

## 2016-06-10 NOTE — Patient Instructions (Signed)
Modafinil tablets  What is this medicine? MODAFINIL (moe DAF i nil) is used to treat excessive sleepiness caused by certain sleep disorders. This includes narcolepsy, sleep apnea, and shift work sleep disorder. This medicine may be used for other purposes; ask your health care provider or pharmacist if you have questions. What should I tell my health care provider before I take this medicine? They need to know if you have any of these conditions: -history of depression, mania, or other mental disorder -kidney disease -liver disease -an unusual or allergic reaction to modafinil, other medicines, foods, dyes, or preservatives -pregnant or trying to get pregnant -breast-feeding How should I use this medicine? Take this medicine by mouth with a glass of water. Follow the directions on the prescription label. Take your doses at regular intervals. Do not take your medicine more often than directed. Do not stop taking except on your doctor's advice. A special MedGuide will be given to you by the pharmacist with each prescription and refill. Be sure to read this information carefully each time. Talk to your pediatrician regarding the use of this medicine in children. This medicine is not approved for use in children. Overdosage: If you think you have taken too much of this medicine contact a poison control center or emergency room at once. NOTE: This medicine is only for you. Do not share this medicine with others. What if I miss a dose? If you miss a dose, take it as soon as you can. If it is almost time for your next dose, take only that dose. Do not take double or extra doses. What may interact with this medicine? Do not take this medicine with any of the following medications: -amphetamine or dextroamphetamine -dexmethylphenidate or methylphenidate -medicines called MAO Inhibitors like Nardil, Parnate, Marplan, Eldepryl -pemoline -procarbazine This medicine may also interact with the  following medications: -antifungal medicines like itraconazole or ketoconazole -barbiturates like phenobarbital -birth control pills or other hormone-containing birth control devices or implants -carbamazepine -cyclosporine -diazepam -medicines for depression, anxiety, or psychotic disturbances -phenytoin -propranolol -triazolam -warfarin This list may not describe all possible interactions. Give your health care provider a list of all the medicines, herbs, non-prescription drugs, or dietary supplements you use. Also tell them if you smoke, drink alcohol, or use illegal drugs. Some items may interact with your medicine. What should I watch for while using this medicine? Visit your doctor or health care professional for regular checks on your progress. The full effects of this medicine may not be seen right away. This medicine may affect your concentration, function, or may hide signs that you are tired. You may get dizzy. Do not drive, use machinery, or do anything that needs mental alertness until you know how this drug affects you. Alcohol can make you more dizzy and may interfere with your response to this medicine or your alertness. Avoid alcoholic drinks. Birth control pills may not work properly while you are taking this medicine. Talk to your doctor about using an extra method of birth control. It is unknown if the effects of this medicine will be increased by the use of caffeine. Caffeine is available in many foods, beverages, and medications. Ask your doctor if you should limit or change your intake of caffeine-containing products while on this medicine. What side effects may I notice from receiving this medicine? Side effects that you should report to your doctor or health care professional as soon as possible: -allergic reactions like skin rash, itching or hives, swelling of the  face, lips, or tongue -anxiety -breathing problems -chest pain -fast, irregular  heartbeat -hallucinations -increased blood pressure -redness, blistering, peeling or loosening of the skin, including inside the mouth -sore throat, fever, or chills -suicidal thoughts or other mood changes -tremors -vomiting Side effects that usually do not require medical attention (report to your doctor or health care professional if they continue or are bothersome): -headache -nausea, diarrhea, or stomach upset -nervousness -trouble sleeping This list may not describe all possible side effects. Call your doctor for medical advice about side effects. You may report side effects to FDA at 1-800-FDA-1088. Where should I keep my medicine? Keep out of the reach of children. This medicine can be abused. Keep your medicine in a safe place to protect it from theft. Do not share this medicine with anyone. Selling or giving away this medicine is dangerous and against the law. This medicine may cause accidental overdose and death if taken by other adults, children, or pets. Mix any unused medicine with a substance like cat litter or coffee grounds. Then throw the medicine away in a sealed container like a sealed bag or a coffee can with a lid. Do not use the medicine after the expiration date. Store at room temperature between 20 and 25 degrees C (68 and 77 degrees F). NOTE: This sheet is a summary. It may not cover all possible information. If you have questions about this medicine, talk to your doctor, pharmacist, or health care provider.    2016, Elsevier/Gold Standard. (2014-04-16 15:34:55) Hypersomnia Hypersomnia is when you feel extremely tired during the day even though you're getting plenty of sleep at night. You may need to take naps during the day, and you may also be extremely difficult to wake up when you are sleeping.  CAUSES  The cause of your hypersomnia may not be known. Hypersomnia may be caused by:   Medicines.  Sleep disorders, such as narcolepsy.  Trauma or injury to your  head or nervous system.  Using drugs or alcohol.  Tumors.  Medical conditions, such as depression or hypothyroidism.  Genetics. SIGNS AND SYMPTOMS  The main symptoms of hypersomnia include:   Feeling extremely tired throughout the day.  Being very difficult to wake up.  Sleeping for longer and longer periods.  Taking naps throughout the day. Other symptoms may include:   Feeling:  Restless.  Annoyed.  Anxious.  Low energy.  Having difficulty:  Remembering.  Speaking.  Thinking.  Losing your appetite.  Experiencing hallucinations. DIAGNOSIS  Hypersomnia may be diagnosed by:  Medical history and physical exam. This will include a sleep history.  Completing sleep logs.  Tests may also be done, such as:  Polysomnography.  Multiple sleep latency test (MSLT). TREATMENT  There is no cure for hypersomnia, but treatment can be very effective in helping manage the condition. Treatment may include:  Lifestyle and sleeping strategies to help cope with the condition.  Stimulant medicines.  Treating any underlying causes of hypersomnia. HOME CARE INSTRUCTIONS  Take medicines only as directed by your health care provider.  Schedule short naps for when you feel sleepiest during the day. Tell your employer or teachers that you have hypersomnia. You may be able to adjust your schedule to include time for naps.  Avoid drinking alcohol or caffeinated beverages.  Do not eat a heavy meal before bedtime. Eat at about the same times every day.  Do not drive or operate heavy machinery if you are sleepy.  Do not swim or go out  on the water without a life jacket.  If possible, adjust your schedule so that you do not have to work or be active at night.  Keep all follow-up visits as directed by your health care provider. This is important. SEEK MEDICAL CARE IF:   You have new symptoms.  Your symptoms get worse. SEEK IMMEDIATE MEDICAL CARE IF:  You have serious  thoughts of hurting yourself or someone else.   This information is not intended to replace advice given to you by your health care provider. Make sure you discuss any questions you have with your health care provider.   Document Released: 07/16/2002 Document Revised: 08/16/2014 Document Reviewed: 02/28/2014 Elsevier Interactive Patient Education 2016 Elsevier Inc. Sleep Apnea Sleep apnea is disorder that affects a person's sleep. A person with sleep apnea has abnormal pauses in their breathing when they sleep. It is hard for them to get a good sleep. This makes a person tired during the day. It also can lead to other physical problems. There are three types of sleep apnea. One type is when breathing stops for a short time because your airway is blocked (obstructive sleep apnea). Another type is when the brain sometimes fails to give the normal signal to breathe to the muscles that control your breathing (central sleep apnea). The third type is a combination of the other two types. HOME CARE  Do not sleep on your back. Try to sleep on your side.  Take all medicine as told by your doctor.  Avoid alcohol, calming medicines (sedatives), and depressant drugs.  Try to lose weight if you are overweight. Talk to your doctor about a healthy weight goal. Your doctor may have you use a device that helps to open your airway. It can help you get the air that you need. It is called a positive airway pressure (PAP) device. There are three types of PAP devices:  Continuous positive airway pressure (CPAP) device.  Nasal expiratory positive airway pressure (EPAP) device.  Bilevel positive airway pressure (BPAP) device. MAKE SURE YOU:  Understand these instructions.  Will watch your condition.  Will get help right away if you are not doing well or get worse.   This information is not intended to replace advice given to you by your health care provider. Make sure you discuss any questions you have  with your health care provider.   Document Released: 05/04/2008 Document Revised: 08/16/2014 Document Reviewed: 11/27/2011 Elsevier Interactive Patient Education [employer].
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
## 2016-06-30 ENCOUNTER — Telehealth: Payer: Self-pay | Admitting: Neurology

## 2016-06-30 NOTE — Telephone Encounter (Signed)
Pt called in to check on DME referral. Please call and advise [phone]

## 2016-06-30 NOTE — Telephone Encounter (Signed)
I spoke to pt. I was not in the office on 06/10/2016 and do not know where the order for his cpap went. There is no documentation. However, I advised pt that I would send the referral for his cpap to [facility] and mark it urgent and they will call him asap. Pt verbalized understanding.   Pt moved his appt to 08/05/16 and cancelled the 07/27/16 appt.

## 2016-07-27 ENCOUNTER — Ambulatory Visit: Payer: 59 | Admitting: Neurology

## 2016-08-05 ENCOUNTER — Ambulatory Visit: Payer: Self-pay | Admitting: Neurology

## 2016-08-10 DIAGNOSIS — G4733 Obstructive sleep apnea (adult) (pediatric): Secondary | ICD-10-CM | POA: Diagnosis not present

## 2016-08-12 DIAGNOSIS — H268 Other specified cataract: Secondary | ICD-10-CM | POA: Diagnosis not present

## 2016-08-12 DIAGNOSIS — H2511 Age-related nuclear cataract, right eye: Secondary | ICD-10-CM | POA: Diagnosis not present

## 2016-08-12 DIAGNOSIS — H25041 Posterior subcapsular polar age-related cataract, right eye: Secondary | ICD-10-CM | POA: Diagnosis not present

## 2016-09-10 DIAGNOSIS — G4733 Obstructive sleep apnea (adult) (pediatric): Secondary | ICD-10-CM | POA: Diagnosis not present

## 2016-09-29 ENCOUNTER — Encounter: Payer: Self-pay | Admitting: Podiatry

## 2016-09-29 ENCOUNTER — Ambulatory Visit (INDEPENDENT_AMBULATORY_CARE_PROVIDER_SITE_OTHER): Payer: 59

## 2016-09-29 ENCOUNTER — Ambulatory Visit (INDEPENDENT_AMBULATORY_CARE_PROVIDER_SITE_OTHER): Payer: 59 | Admitting: Podiatry

## 2016-09-29 DIAGNOSIS — M722 Plantar fascial fibromatosis: Secondary | ICD-10-CM | POA: Diagnosis not present

## 2016-09-29 DIAGNOSIS — M25571 Pain in right ankle and joints of right foot: Secondary | ICD-10-CM | POA: Diagnosis not present

## 2016-09-29 MED ORDER — TRIAMCINOLONE ACETONIDE 10 MG/ML IJ SUSP
10.0000 mg | Freq: Once | INTRAMUSCULAR | Status: AC
  Administered 2016-09-29: 10 mg

## 2016-09-29 NOTE — Progress Notes (Signed)
Subjective:     Patient ID: Danny Henderson, male   DOB: 1966-03-26, 51 y.o.   MRN: [id number]  HPI patient presents stating that he's having pain in the plantar of the right heel and also is having pain in his right ankle   Review of Systems     Objective:   Physical Exam Neurovascular status intact negative Homans sign was noted with patient found to have discomfort plantar right heel and in the right ankle lateral side with inflammation fluid buildup within the sinus tarsi and lateral ankle ligaments    Assessment:     Plantar fasciitis right with inflammation fluid buildup and moderate ankle sprain    Plan:     H&P conditions reviewed and we'll focus on the heel today. I injected the plantar fascia 3 mg Kenalog 5 mill grams Xylocaine and scanned for custom orthotic devices and placed in fascially brace to lift the arch. Gave instructions on physical therapy placed on diclofenac 75 mg twice a day and reappoint to recheck  X-rays right foot and ankle reveal plantar spur formation with no indication of stress fracture or arthritic condition

## 2016-09-30 NOTE — Progress Notes (Signed)
Patient ID: Danny Henderson, male   DOB: [birth year], 51 y.o.   MRN: [id number] [name] CPed cast for foot orthotics poly/ppt/spenco RF varus wedge 5 degree heel punch and cushion [employer] to fab

## 2016-10-08 DIAGNOSIS — G4733 Obstructive sleep apnea (adult) (pediatric): Secondary | ICD-10-CM | POA: Diagnosis not present

## 2016-10-18 DIAGNOSIS — G4733 Obstructive sleep apnea (adult) (pediatric): Secondary | ICD-10-CM | POA: Diagnosis not present

## 2016-10-20 ENCOUNTER — Other Ambulatory Visit: Payer: 59

## 2016-11-08 DIAGNOSIS — G4733 Obstructive sleep apnea (adult) (pediatric): Secondary | ICD-10-CM | POA: Diagnosis not present

## 2016-11-14 DIAGNOSIS — J309 Allergic rhinitis, unspecified: Secondary | ICD-10-CM | POA: Diagnosis not present

## 2016-11-14 DIAGNOSIS — J0100 Acute maxillary sinusitis, unspecified: Secondary | ICD-10-CM | POA: Diagnosis not present

## 2016-11-14 DIAGNOSIS — J218 Acute bronchiolitis due to other specified organisms: Secondary | ICD-10-CM | POA: Diagnosis not present

## 2016-12-08 DIAGNOSIS — G4733 Obstructive sleep apnea (adult) (pediatric): Secondary | ICD-10-CM | POA: Diagnosis not present

## 2017-01-06 ENCOUNTER — Telehealth: Payer: Self-pay | Admitting: Internal Medicine

## 2017-01-06 NOTE — Telephone Encounter (Signed)
Patient's wife called stating that patient got a tick bite a few days ago and he is now having back and side pain. Wife not sure if it is related to tick bite or if he may have a kidney stone. Offered appointment with PA today at 4:45 but wife declined stating she wanted to bring him in right now and she does not want to take him to the ER because he will be exposed to other germs. Transferred wife to [facility] to speak directly with a nurse.
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
## 2017-01-08 DIAGNOSIS — G4733 Obstructive sleep apnea (adult) (pediatric): Secondary | ICD-10-CM | POA: Diagnosis not present

## 2017-02-07 ENCOUNTER — Encounter (HOSPITAL_BASED_OUTPATIENT_CLINIC_OR_DEPARTMENT_OTHER): Payer: Self-pay | Admitting: *Deleted

## 2017-02-07 ENCOUNTER — Emergency Department (HOSPITAL_BASED_OUTPATIENT_CLINIC_OR_DEPARTMENT_OTHER)
Admission: EM | Admit: 2017-02-07 | Discharge: 2017-02-07 | Disposition: A | Discharge status: Home / Self Care | Payer: 59 | Attending: Emergency Medicine | Admitting: Emergency Medicine

## 2017-02-07 ENCOUNTER — Emergency Department (HOSPITAL_BASED_OUTPATIENT_CLINIC_OR_DEPARTMENT_OTHER): Payer: 59

## 2017-02-07 DIAGNOSIS — I10 Essential (primary) hypertension: Secondary | ICD-10-CM | POA: Insufficient documentation

## 2017-02-07 DIAGNOSIS — N201 Calculus of ureter: Secondary | ICD-10-CM

## 2017-02-07 DIAGNOSIS — G4733 Obstructive sleep apnea (adult) (pediatric): Secondary | ICD-10-CM | POA: Diagnosis not present

## 2017-02-07 DIAGNOSIS — R1012 Left upper quadrant pain: Secondary | ICD-10-CM | POA: Diagnosis present

## 2017-02-07 DIAGNOSIS — Z79899 Other long term (current) drug therapy: Secondary | ICD-10-CM | POA: Insufficient documentation

## 2017-02-07 DIAGNOSIS — R109 Unspecified abdominal pain: Secondary | ICD-10-CM | POA: Diagnosis not present

## 2017-02-07 LAB — COMPREHENSIVE METABOLIC PANEL
ALBUMIN: 4.3 g/dL (ref 3.5–5.0)
ALT: 31 U/L (ref 17–63)
AST: 28 U/L (ref 15–41)
Alkaline Phosphatase: 73 U/L (ref 38–126)
Anion gap: 7 (ref 5–15)
BILIRUBIN TOTAL: 0.8 mg/dL (ref 0.3–1.2)
BUN: 14 mg/dL (ref 6–20)
CHLORIDE: 105 mmol/L (ref 101–111)
CO2: 25 mmol/L (ref 22–32)
CREATININE: 1.22 mg/dL (ref 0.61–1.24)
Calcium: 9.1 mg/dL (ref 8.9–10.3)
GFR calc Af Amer: >60 mL/min (ref >60)
GLUCOSE: 106 mg/dL — AB (ref 65–99)
POTASSIUM: 3.9 mmol/L (ref 3.5–5.1)
Sodium: 137 mmol/L (ref 135–145)
TOTAL PROTEIN: 7 g/dL (ref 6.5–8.1)

## 2017-02-07 LAB — URINALYSIS, ROUTINE W REFLEX MICROSCOPIC
GLUCOSE, UA: NEGATIVE mg/dL
Ketones, ur: 15 mg/dL — AB
Leukocytes, UA: NEGATIVE
Nitrite: NEGATIVE
PROTEIN: NEGATIVE mg/dL
Specific Gravity, Urine: 1.027 (ref 1.005–1.030)
pH: 5.5 (ref 5.0–8.0)

## 2017-02-07 LAB — CBC
HEMATOCRIT: 41.9 % (ref 39.0–52.0)
Hemoglobin: 14.3 g/dL (ref 13.0–17.0)
MCH: 30.6 pg (ref 26.0–34.0)
MCHC: 34.1 g/dL (ref 30.0–36.0)
MCV: 89.7 fL (ref 78.0–100.0)
PLATELETS: 193 10*3/uL (ref 150–400)
RBC: 4.67 MIL/uL (ref 4.22–5.81)
RDW: 13 % (ref 11.5–15.5)
WBC: 11.9 10*3/uL — ABNORMAL HIGH (ref 4.0–10.5)

## 2017-02-07 LAB — URINALYSIS, MICROSCOPIC (REFLEX)

## 2017-02-07 MED ORDER — MORPHINE SULFATE (PF) 4 MG/ML IV SOLN
4.0000 mg | Freq: Once | INTRAVENOUS | Status: AC
  Administered 2017-02-07: 4 mg via INTRAVENOUS
  Filled 2017-02-07: qty 1

## 2017-02-07 MED ORDER — FENTANYL CITRATE (PF) 100 MCG/2ML IJ SOLN
100.0000 ug | Freq: Once | INTRAMUSCULAR | Status: AC
  Administered 2017-02-07: 100 ug via INTRAVENOUS
  Filled 2017-02-07: qty 2

## 2017-02-07 MED ORDER — TAMSULOSIN HCL 0.4 MG PO CAPS: 0.4000 mg | ORAL_CAPSULE | Freq: Every day | ORAL | 0 refills | Status: DC

## 2017-02-07 MED ORDER — MORPHINE SULFATE (PF) 4 MG/ML IV SOLN
6.0000 mg | Freq: Once | INTRAVENOUS | Status: AC
  Administered 2017-02-07: 6 mg via INTRAVENOUS
  Filled 2017-02-07: qty 2

## 2017-02-07 MED ORDER — ONDANSETRON 4 MG PO TBDP: 4.0000 mg | ORAL_TABLET | Freq: Three times a day (TID) | ORAL | 0 refills | Status: DC | PRN

## 2017-02-07 MED ORDER — ONDANSETRON HCL 4 MG/2ML IJ SOLN
4.0000 mg | Freq: Once | INTRAMUSCULAR | Status: AC
  Administered 2017-02-07: 4 mg via INTRAVENOUS
  Filled 2017-02-07: qty 2

## 2017-02-07 MED ORDER — OXYCODONE-ACETAMINOPHEN 5-325 MG PO TABS
2.0000 | ORAL_TABLET | Freq: Once | ORAL | Status: AC
  Administered 2017-02-07: 2 via ORAL
  Filled 2017-02-07: qty 2

## 2017-02-07 MED ORDER — OXYCODONE-ACETAMINOPHEN 5-325 MG PO TABS: 1.0000 | ORAL_TABLET | ORAL | 0 refills | Status: DC | PRN
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
## 2017-02-07 NOTE — ED Triage Notes (Signed)
Left flank pain a week ago that went away. Came back at 10 am. He took Aleve at 11am with no relief.

## 2017-02-08 ENCOUNTER — Ambulatory Visit: Payer: 59 | Admitting: Internal Medicine

## 2017-03-10 DIAGNOSIS — G4733 Obstructive sleep apnea (adult) (pediatric): Secondary | ICD-10-CM | POA: Diagnosis not present

## 2017-03-14 ENCOUNTER — Encounter: Payer: 59 | Admitting: Internal Medicine

## 2017-03-15 ENCOUNTER — Other Ambulatory Visit: Payer: Self-pay | Admitting: Internal Medicine

## 2017-03-18 ENCOUNTER — Ambulatory Visit (INDEPENDENT_AMBULATORY_CARE_PROVIDER_SITE_OTHER): Payer: 59 | Admitting: Internal Medicine

## 2017-03-18 ENCOUNTER — Encounter: Payer: Self-pay | Admitting: Internal Medicine

## 2017-03-18 ENCOUNTER — Encounter: Payer: Self-pay | Admitting: Gastroenterology

## 2017-03-18 VITALS — BP 122/80 | HR 81 | Temp 98.1°F | Resp 14 | Ht 72.0 in | Wt 210.2 lb

## 2017-03-18 DIAGNOSIS — K219 Gastro-esophageal reflux disease without esophagitis: Secondary | ICD-10-CM

## 2017-03-18 DIAGNOSIS — Z0000 Encounter for general adult medical examination without abnormal findings: Secondary | ICD-10-CM

## 2017-03-18 DIAGNOSIS — N200 Calculus of kidney: Secondary | ICD-10-CM

## 2017-03-18 DIAGNOSIS — Z1211 Encounter for screening for malignant neoplasm of colon: Secondary | ICD-10-CM

## 2017-03-18 NOTE — Patient Instructions (Addendum)
   GO TO THE FRONT DESK Schedule your next appointment for a  physical exam in one year  Schedule labs to be done next week, fasting

## 2017-03-18 NOTE — Progress Notes (Signed)
Pre visit review using our clinic review tool, if applicable. No additional management support is needed unless otherwise documented below in the visit note. 

## 2017-03-18 NOTE — Assessment & Plan Note (Signed)
-  Td 2011  -Colon cancer screening: Referred patient to GI due to GERD, recommend to discuss colonoscopy with them -prostate cancer screening: DRE - PSA wnl 2017 -Diet and exercise discussed + FH CAD: plan is to control CVRF -labs: Will come back fasting for BMP, FLP, A1c -Diet and exercise discussed
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
## 2017-03-19 NOTE — Assessment & Plan Note (Signed)
Hyperglycemia: Check an A1c. HTN: Seems well-controlled on losartan. OSA Good compliance with CPAP, he feels satisfied with the treatment. At some point was prescribed Provigil but he didn't like it and doesn't seem to be needed it. Kidney stone: Few weeks ago developed left back pain, CT documented left urolithiasis with mild hydronephrosis. Currently asx. Will check ultrasound to document resolution of hydronephrosis. GERD: Not well controlled despite taking Dexilant every day before breakfast. Refer to GI. RTC one year

## 2017-03-24 ENCOUNTER — Ambulatory Visit (HOSPITAL_BASED_OUTPATIENT_CLINIC_OR_DEPARTMENT_OTHER)
Admission: RE | Admit: 2017-03-24 | Discharge: 2017-03-24 | Disposition: A | Discharge status: Home / Self Care | Payer: 59 | Source: Ambulatory Visit | Attending: Internal Medicine | Admitting: Internal Medicine

## 2017-03-24 ENCOUNTER — Other Ambulatory Visit (INDEPENDENT_AMBULATORY_CARE_PROVIDER_SITE_OTHER): Payer: 59

## 2017-03-24 DIAGNOSIS — Z0000 Encounter for general adult medical examination without abnormal findings: Secondary | ICD-10-CM | POA: Diagnosis not present

## 2017-03-24 DIAGNOSIS — Z87448 Personal history of other diseases of urinary system: Secondary | ICD-10-CM | POA: Diagnosis not present

## 2017-03-24 DIAGNOSIS — N200 Calculus of kidney: Secondary | ICD-10-CM | POA: Diagnosis not present

## 2017-03-24 LAB — LIPID PANEL
CHOL/HDL RATIO: 4
CHOLESTEROL: 169 mg/dL (ref 0–200)
HDL: 45.5 mg/dL (ref >39.00)
LDL Cholesterol: 102 mg/dL — ABNORMAL HIGH (ref 0–99)
NonHDL: 123.36
TRIGLYCERIDES: 105 mg/dL (ref 0.0–149.0)
VLDL: 21 mg/dL (ref 0.0–40.0)

## 2017-03-24 LAB — BASIC METABOLIC PANEL
BUN: 11 mg/dL (ref 6–23)
CALCIUM: 9.5 mg/dL (ref 8.4–10.5)
CO2: 27 meq/L (ref 19–32)
CREATININE: 1.05 mg/dL (ref 0.40–1.50)
Chloride: 104 mEq/L (ref 96–112)
GFR: 78.89 mL/min (ref >60.00)
Glucose, Bld: 101 mg/dL — ABNORMAL HIGH (ref 70–99)
Potassium: 4.3 mEq/L (ref 3.5–5.1)
SODIUM: 136 meq/L (ref 135–145)

## 2017-03-24 LAB — HEMOGLOBIN A1C: Hgb A1c MFr Bld: 5.8 % (ref 4.6–6.5)

## 2017-03-31 ENCOUNTER — Emergency Department (HOSPITAL_BASED_OUTPATIENT_CLINIC_OR_DEPARTMENT_OTHER): Payer: 59

## 2017-03-31 ENCOUNTER — Encounter (HOSPITAL_BASED_OUTPATIENT_CLINIC_OR_DEPARTMENT_OTHER): Payer: Self-pay

## 2017-03-31 ENCOUNTER — Emergency Department (HOSPITAL_BASED_OUTPATIENT_CLINIC_OR_DEPARTMENT_OTHER)
Admission: EM | Admit: 2017-03-31 | Discharge: 2017-03-31 | Disposition: A | Discharge status: Home / Self Care | Payer: 59 | Attending: Emergency Medicine | Admitting: Emergency Medicine

## 2017-03-31 DIAGNOSIS — I10 Essential (primary) hypertension: Secondary | ICD-10-CM | POA: Diagnosis not present

## 2017-03-31 DIAGNOSIS — N201 Calculus of ureter: Secondary | ICD-10-CM | POA: Insufficient documentation

## 2017-03-31 DIAGNOSIS — N132 Hydronephrosis with renal and ureteral calculous obstruction: Secondary | ICD-10-CM | POA: Diagnosis not present

## 2017-03-31 DIAGNOSIS — R1032 Left lower quadrant pain: Secondary | ICD-10-CM | POA: Diagnosis not present

## 2017-03-31 DIAGNOSIS — R109 Unspecified abdominal pain: Secondary | ICD-10-CM | POA: Diagnosis not present

## 2017-03-31 HISTORY — DX: Calculus of kidney: N20.0

## 2017-03-31 LAB — COMPREHENSIVE METABOLIC PANEL WITH GFR
ALT: 36 U/L (ref 17–63)
AST: 31 U/L (ref 15–41)
Albumin: 4.4 g/dL (ref 3.5–5.0)
Alkaline Phosphatase: 72 U/L (ref 38–126)
Anion gap: 10 (ref 5–15)
BUN: 13 mg/dL (ref 6–20)
CO2: 24 mmol/L (ref 22–32)
Calcium: 9.1 mg/dL (ref 8.9–10.3)
Chloride: 102 mmol/L (ref 101–111)
Creatinine, Ser: 1.23 mg/dL (ref 0.61–1.24)
GFR calc Af Amer: >60 mL/min
GFR calc non Af Amer: >60 mL/min
Glucose, Bld: 160 mg/dL — ABNORMAL HIGH (ref 65–99)
Potassium: 4 mmol/L (ref 3.5–5.1)
Sodium: 136 mmol/L (ref 135–145)
Total Bilirubin: 0.7 mg/dL (ref 0.3–1.2)
Total Protein: 7.5 g/dL (ref 6.5–8.1)

## 2017-03-31 LAB — CBC WITH DIFFERENTIAL/PLATELET
Basophils Absolute: 0 K/uL (ref 0.0–0.1)
Basophils Relative: 0 %
Eosinophils Absolute: 0 K/uL (ref 0.0–0.7)
Eosinophils Relative: 0 %
HCT: 43.8 % (ref 39.0–52.0)
Hemoglobin: 14.8 g/dL (ref 13.0–17.0)
Lymphocytes Relative: 22 %
Lymphs Abs: 2.4 K/uL (ref 0.7–4.0)
MCH: 30 pg (ref 26.0–34.0)
MCHC: 33.8 g/dL (ref 30.0–36.0)
MCV: 88.7 fL (ref 78.0–100.0)
Monocytes Absolute: 0.9 K/uL (ref 0.1–1.0)
Monocytes Relative: 8 %
Neutro Abs: 7.7 K/uL (ref 1.7–7.7)
Neutrophils Relative %: 70 %
Platelets: 196 K/uL (ref 150–400)
RBC: 4.94 MIL/uL (ref 4.22–5.81)
RDW: 12.7 % (ref 11.5–15.5)
WBC: 11.1 K/uL — ABNORMAL HIGH (ref 4.0–10.5)

## 2017-03-31 LAB — URINALYSIS, ROUTINE W REFLEX MICROSCOPIC
Bilirubin Urine: NEGATIVE
Glucose, UA: NEGATIVE mg/dL
Ketones, ur: NEGATIVE mg/dL
Leukocytes, UA: NEGATIVE
Nitrite: NEGATIVE
Protein, ur: NEGATIVE mg/dL
Specific Gravity, Urine: 1.027 (ref 1.005–1.030)
pH: 5.5 (ref 5.0–8.0)

## 2017-03-31 LAB — URINALYSIS, MICROSCOPIC (REFLEX)

## 2017-03-31 MED ORDER — HYDROMORPHONE HCL 1 MG/ML IJ SOLN
1.0000 mg | Freq: Once | INTRAMUSCULAR | Status: AC
  Administered 2017-03-31: 1 mg via INTRAVENOUS
  Filled 2017-03-31: qty 1

## 2017-03-31 MED ORDER — ONDANSETRON HCL 4 MG/2ML IJ SOLN
4.0000 mg | Freq: Once | INTRAMUSCULAR | Status: AC
Start: 2017-03-31 — End: 2017-03-31
  Administered 2017-03-31: 4 mg via INTRAVENOUS
  Filled 2017-03-31: qty 2

## 2017-03-31 MED ORDER — TAMSULOSIN HCL 0.4 MG PO CAPS: ORAL_CAPSULE | ORAL | 0 refills | Status: DC

## 2017-03-31 MED ORDER — TAMSULOSIN HCL 0.4 MG PO CAPS
0.4000 mg | ORAL_CAPSULE | Freq: Once | ORAL | Status: AC
  Administered 2017-03-31: 0.4 mg via ORAL
  Filled 2017-03-31: qty 1

## 2017-03-31 MED ORDER — ONDANSETRON 4 MG PO TBDP
4.0000 mg | ORAL_TABLET | Freq: Once | ORAL | Status: AC
  Administered 2017-03-31: 4 mg via ORAL
  Filled 2017-03-31: qty 1

## 2017-03-31 MED ORDER — ONDANSETRON 8 MG PO TBDP: 8.0000 mg | ORAL_TABLET | Freq: Three times a day (TID) | ORAL | 0 refills | Status: DC | PRN

## 2017-03-31 MED ORDER — HYDROMORPHONE HCL 2 MG PO TABS: 2.0000 mg | ORAL_TABLET | ORAL | 0 refills | Status: DC | PRN

## 2017-03-31 NOTE — ED Triage Notes (Signed)
Pt vomiting in restroom

## 2017-03-31 NOTE — ED Triage Notes (Signed)
Pt c/o left side flank pain with nausea since 1930, he took one percocet at that time and then another at 2130
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
## 2017-03-31 NOTE — ED Notes (Signed)
Pt verbalizes understanding of d/c instructions and denies any further needs at this time. 

## 2017-04-06 DIAGNOSIS — N201 Calculus of ureter: Secondary | ICD-10-CM | POA: Diagnosis not present

## 2017-04-08 DIAGNOSIS — H25042 Posterior subcapsular polar age-related cataract, left eye: Secondary | ICD-10-CM | POA: Diagnosis not present

## 2017-04-08 DIAGNOSIS — Z961 Presence of intraocular lens: Secondary | ICD-10-CM | POA: Diagnosis not present

## 2017-04-08 DIAGNOSIS — H43393 Other vitreous opacities, bilateral: Secondary | ICD-10-CM | POA: Diagnosis not present

## 2017-04-10 DIAGNOSIS — G4733 Obstructive sleep apnea (adult) (pediatric): Secondary | ICD-10-CM | POA: Diagnosis not present

## 2017-04-12 ENCOUNTER — Other Ambulatory Visit: Payer: Self-pay | Admitting: Internal Medicine

## 2017-04-12 DIAGNOSIS — K219 Gastro-esophageal reflux disease without esophagitis: Secondary | ICD-10-CM

## 2017-04-13 DIAGNOSIS — R3915 Urgency of urination: Secondary | ICD-10-CM | POA: Diagnosis not present

## 2017-04-13 DIAGNOSIS — R8271 Bacteriuria: Secondary | ICD-10-CM | POA: Diagnosis not present

## 2017-04-13 DIAGNOSIS — N201 Calculus of ureter: Secondary | ICD-10-CM | POA: Diagnosis not present

## 2017-04-20 DIAGNOSIS — G4733 Obstructive sleep apnea (adult) (pediatric): Secondary | ICD-10-CM | POA: Diagnosis not present

## 2017-04-23 ENCOUNTER — Other Ambulatory Visit: Payer: Self-pay | Admitting: Internal Medicine

## 2017-05-10 DIAGNOSIS — G4733 Obstructive sleep apnea (adult) (pediatric): Secondary | ICD-10-CM | POA: Diagnosis not present

## 2017-05-13 ENCOUNTER — Ambulatory Visit: Payer: 59 | Admitting: Gastroenterology

## 2017-05-18 ENCOUNTER — Encounter: Payer: Self-pay | Admitting: Podiatry

## 2017-05-18 ENCOUNTER — Ambulatory Visit (INDEPENDENT_AMBULATORY_CARE_PROVIDER_SITE_OTHER): Payer: 59 | Admitting: Podiatry

## 2017-05-18 DIAGNOSIS — M722 Plantar fascial fibromatosis: Secondary | ICD-10-CM

## 2017-05-18 MED ORDER — TRIAMCINOLONE ACETONIDE 10 MG/ML IJ SUSP
10.0000 mg | Freq: Once | INTRAMUSCULAR | Status: AC
  Administered 2017-05-18: 10 mg

## 2017-05-19 NOTE — Progress Notes (Signed)
Subjective:    Patient ID: Danny Henderson, male   DOB: 51 y.o.   MRN: [id number]   HPI patient states still having a lot of pain in his right heel and it's been present and while improved from previous still quite sore and he is very active on his feet and has trouble with generalized ambulation    ROS      Objective:  Physical Exam neurovascular status intact with exquisite discomfort plantar aspect right heel insertional point tendon calcaneus with depression of the arch and pain that's worse in the morning and after periods of sitting     Assessment:    Acute plantar fasciitis right over left     Plan:   H&P conditions reviewed and I reinjected the plantar fascia right 3 mg Kenalog 5 mill grams Xylocaine I scanned for customized orthotic devices to reduce plantar stress on his feet and I dispensed night splint with all instructions on usage. Reappoint when orthotics are ready to be dispensed by Liliane Channel and patient will be seen back for further evaluation

## 2017-06-01 ENCOUNTER — Ambulatory Visit (INDEPENDENT_AMBULATORY_CARE_PROVIDER_SITE_OTHER): Payer: 59 | Admitting: Gastroenterology

## 2017-06-01 ENCOUNTER — Encounter: Payer: Self-pay | Admitting: Gastroenterology

## 2017-06-01 VITALS — BP 134/88 | HR 100 | Ht 72.0 in | Wt 210.8 lb

## 2017-06-01 DIAGNOSIS — R1013 Epigastric pain: Secondary | ICD-10-CM

## 2017-06-01 DIAGNOSIS — K219 Gastro-esophageal reflux disease without esophagitis: Secondary | ICD-10-CM

## 2017-06-01 DIAGNOSIS — Z1211 Encounter for screening for malignant neoplasm of colon: Secondary | ICD-10-CM

## 2017-06-01 MED ORDER — NA SULFATE-K SULFATE-MG SULF 17.5-3.13-1.6 GM/177ML PO SOLN: 1.0000 | Freq: Once | ORAL | 0 refills | Status: AC

## 2017-06-01 NOTE — Patient Instructions (Signed)
If you are age 52 or older, your body mass index should be between 23-30. Your Body mass index is 28.59 kg/m. If this is out of the aforementioned range listed, please consider follow up with your Primary Care Provider.  If you are age 66 or younger, your body mass index should be between 19-25. Your Body mass index is 28.59 kg/m. If this is out of the aformentioned range listed, please consider follow up with your Primary Care Provider.   We have sent the following medications to your pharmacy for you to pick up at your convenience:  [facility] have been scheduled for an endoscopy and colonoscopy. Please follow the written instructions given to you at your visit today. Please pick up your prep supplies at the pharmacy within the next 1-3 days. If you use inhalers (even only as needed), please bring them with you on the day of your procedure. Your physician has requested that you go to www.startemmi.com and enter the access code given to you at your visit today. This web site gives a general overview about your procedure. However, you should still follow specific instructions given to you by our office regarding your preparation for the procedure.  Thank you.
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
## 2017-06-10 DIAGNOSIS — G4733 Obstructive sleep apnea (adult) (pediatric): Secondary | ICD-10-CM | POA: Diagnosis not present

## 2017-06-27 ENCOUNTER — Encounter: Payer: Self-pay | Admitting: Gastroenterology

## 2017-07-11 ENCOUNTER — Ambulatory Visit (AMBULATORY_SURGERY_CENTER): Payer: 59 | Admitting: Gastroenterology

## 2017-07-11 ENCOUNTER — Encounter: Payer: Self-pay | Admitting: Gastroenterology

## 2017-07-11 VITALS — BP 145/98 | HR 66 | Temp 98.7°F | Resp 16 | Ht 72.0 in | Wt 210.0 lb

## 2017-07-11 DIAGNOSIS — R1013 Epigastric pain: Secondary | ICD-10-CM | POA: Diagnosis not present

## 2017-07-11 DIAGNOSIS — D120 Benign neoplasm of cecum: Secondary | ICD-10-CM

## 2017-07-11 DIAGNOSIS — K635 Polyp of colon: Secondary | ICD-10-CM | POA: Diagnosis not present

## 2017-07-11 DIAGNOSIS — Z1211 Encounter for screening for malignant neoplasm of colon: Secondary | ICD-10-CM

## 2017-07-11 DIAGNOSIS — K5140 Inflammatory polyps of colon without complications: Secondary | ICD-10-CM | POA: Diagnosis not present

## 2017-07-11 MED ORDER — SODIUM CHLORIDE 0.9 % IV SOLN: 500.0000 mL | INTRAVENOUS | Status: DC

## 2017-07-11 NOTE — Progress Notes (Signed)
A and O x3. Report to RN. Tolerated MAC anesthesia well.

## 2017-07-11 NOTE — Progress Notes (Signed)
Called to room to assist during endoscopic procedure.  Patient ID and intended procedure confirmed with present staff. Received instructions for my participation in the procedure from the performing physician.  

## 2017-07-11 NOTE — Op Note (Signed)
[facility] Patient Name: Danny Henderson Procedure Date: 07/11/2017 2:38 PM MRN: [id number] Endoscopist: [place]. Loletha Carrow , MD Age: 51 Referring MD:  Date of Birth: Oct 15, 1965 Gender: Male Account #: [id number] Procedure:                Upper GI endoscopy Indications:              Epigastric abdominal pain, Abdominal bloating Medicines:                Monitored Anesthesia Care Procedure:                Pre-Anesthesia Assessment:                           - Prior to the procedure, a History and Physical                            was performed, and patient medications and                            allergies were reviewed. The patient's tolerance of                            previous anesthesia was also reviewed. The risks                            and benefits of the procedure and the sedation                            options and risks were discussed with the patient.                            All questions were answered, and informed consent                            was obtained. Prior Anticoagulants: The patient has                            taken no previous anticoagulant or antiplatelet                            agents. ASA Grade Assessment: II - A patient with                            mild systemic disease. After reviewing the risks                            and benefits, the patient was deemed in                            satisfactory condition to undergo the procedure.                           After obtaining informed consent, the endoscope was  passed under direct vision. Throughout the                            procedure, the patient's blood pressure, pulse, and                            oxygen saturations were monitored continuously. The                            Endoscope was introduced through the mouth, and                            advanced to the second part of duodenum. The upper                            GI endoscopy  was accomplished without difficulty.                            The patient tolerated the procedure well. Scope In: Scope Out: Findings:                 The larynx was normal.                           The esophagus was normal.                           The stomach was normal.                           The cardia and gastric fundus were normal on                            retroflexion.                           The examined duodenum was normal. Complications:            No immediate complications. Estimated Blood Loss:     Estimated blood loss: none. Impression:               - Normal larynx.                           - Normal esophagus.                           - Normal stomach.                           - Normal examined duodenum.                           - No specimens collected. Recommendation:           - Patient has a contact number available for                            emergencies. The signs and  symptoms of potential                            delayed complications were discussed with the                            patient. Return to normal activities tomorrow.                            Written discharge instructions were provided to the                            patient.                           - Resume previous diet.                           - Continue present medications.                           - See the other procedure note for documentation of                            additional recommendations. Henry L. Loletha Carrow, MD 07/11/2017 3:07:42 PM This report has been signed electronically.

## 2017-07-11 NOTE — Patient Instructions (Addendum)
YOU HAD AN ENDOSCOPIC PROCEDURE TODAY AT THE [facility]:   Refer to the procedure report that was given to you for any specific questions about what was found during the examination.  If the procedure report does not answer your questions, please call your gastroenterologist to clarify.  If you requested that your care partner not be given the details of your procedure findings, then the procedure report has been included in a sealed envelope for you to review at your convenience later.  YOU SHOULD EXPECT: Some feelings of bloating in the abdomen. Passage of more gas than usual.  Walking can help get rid of the air that was put into your GI tract during the procedure and reduce the bloating. If you had a lower endoscopy (such as a colonoscopy or flexible sigmoidoscopy) you may notice spotting of blood in your stool or on the toilet paper. If you underwent a bowel prep for your procedure, you may not have a normal bowel movement for a few days.  Please Note:  You might notice some irritation and congestion in your nose or some drainage.  This is from the oxygen used during your procedure.  There is no need for concern and it should clear up in a day or so.  SYMPTOMS TO REPORT IMMEDIATELY:   Following lower endoscopy (colonoscopy or flexible sigmoidoscopy):  Excessive amounts of blood in the stool  Significant tenderness or worsening of abdominal pains  Swelling of the abdomen that is new, acute  Fever of 100F or higher   Following upper endoscopy (EGD)  Vomiting of blood or coffee ground material  New chest pain or pain under the shoulder blades  Painful or persistently difficult swallowing  New shortness of breath  Fever of 100F or higher  Black, tarry-looking stools  For urgent or emergent issues, a gastroenterologist can be reached at any hour by calling [phone].  Please read all handouts given to you by your recovery nurse.  DIET:  We do recommend a small meal at  first, but then you may proceed to your regular diet.  Drink plenty of fluids but you should avoid alcoholic beverages for 24 hours.  ACTIVITY:  You should plan to take it easy for the rest of today and you should NOT DRIVE or use heavy machinery until tomorrow (because of the sedation medicines used during the test).    FOLLOW UP: Our staff will call the number listed on your records the next business day following your procedure to check on you and address any questions or concerns that you may have regarding the information given to you following your procedure. If we do not reach you, we will leave a message.  However, if you are feeling well and you are not experiencing any problems, there is no need to return our call.  We will assume that you have returned to your regular daily activities without incident.  If any biopsies were taken you will be contacted by phone or by letter within the next 1-3 weeks.  Please call us at [phone] if you have not heard about the biopsies in 3 weeks.    SIGNATURES/CONFIDENTIALITY: You and/or your care partner have signed paperwork which will be entered into your electronic medical record.  These signatures attest to the fact that that the information above on your After Visit Summary has been reviewed and is understood.  Full responsibility of the confidentiality of this discharge information lies with you and/or your care-partner. Thank   you for letting us take care of your healthcare needs today. 

## 2017-07-11 NOTE — Op Note (Signed)
[facility] Patient Name: Danny Henderson Procedure Date: 07/11/2017 2:38 PM MRN: [id number] Endoscopist: [place]. Loletha Carrow , MD Age: 51 Referring MD:  Date of Birth: Dec 13, 1965 Gender: Male Account #: [id number] Procedure:                Colonoscopy Indications:              Screening for colorectal malignant neoplasm, This                            is the patient's first colonoscopy Medicines:                Monitored Anesthesia Care Procedure:                Pre-Anesthesia Assessment:                           - Prior to the procedure, a History and Physical                            was performed, and patient medications and                            allergies were reviewed. The patient's tolerance of                            previous anesthesia was also reviewed. The risks                            and benefits of the procedure and the sedation                            options and risks were discussed with the patient.                            All questions were answered, and informed consent                            was obtained. Prior Anticoagulants: The patient has                            taken no previous anticoagulant or antiplatelet                            agents. ASA Grade Assessment: II - A patient with                            mild systemic disease. After reviewing the risks                            and benefits, the patient was deemed in                            satisfactory condition to undergo the procedure.  After obtaining informed consent, the colonoscope                            was passed under direct vision. Throughout the                            procedure, the patient's blood pressure, pulse, and                            oxygen saturations were monitored continuously. The                            Colonoscope was introduced through the anus and                            advanced to the the cecum,  identified by                            appendiceal orifice and ileocecal valve. The                            colonoscopy was performed without difficulty. The                            patient tolerated the procedure well. The quality                            of the bowel preparation was good. The ileocecal                            valve, appendiceal orifice, and rectum were                            photographed. The quality of the bowel preparation                            was evaluated using the BBPS [facility] Bowel                            Preparation Scale) with scores of: Right Colon = 2,                            Transverse Colon = 2 and Left Colon = 2. The total                            BBPS score equals 6. The bowel preparation used was                            SUPREP. Scope In: 2:51:12 PM Scope Out: 3:03:24 PM Scope Withdrawal Time: 0 hours 9 minutes 55 seconds  Total Procedure Duration: 0 hours 12 minutes 12 seconds  Findings:                 The perianal and digital rectal  examinations were                            normal.                           Multiple diverticula were found in the left colon                            and right colon.                           A 5 mm polyp was found in the cecum. The polyp was                            sessile. The polyp was removed with a cold snare.                            Resection and retrieval were complete.                           The exam was otherwise without abnormality on                            direct and retroflexion views. Complications:            No immediate complications. Estimated Blood Loss:     Estimated blood loss was minimal. Impression:               - Diverticulosis in the left colon and in the right                            colon.                           - One 5 mm polyp in the cecum, removed with a cold                            snare. Resected and retrieved.                            - The examination was otherwise normal on direct                            and retroflexion views. Recommendation:           - Patient has a contact number available for                            emergencies. The signs and symptoms of potential                            delayed complications were discussed with the                            patient. Return to normal activities tomorrow.  Written discharge instructions were provided to the                            patient.                           - Resume previous diet.                           - Continue present medications.                           - Await pathology results.                           - Repeat colonoscopy is recommended for                            surveillance. The colonoscopy date will be                            determined after pathology results from today's                            exam become available for review. [place] L. Loletha Carrow, MD 07/11/2017 3:10:04 PM This report has been signed electronically.

## 2017-07-12 ENCOUNTER — Telehealth: Payer: Self-pay | Admitting: *Deleted

## 2017-07-12 NOTE — Telephone Encounter (Signed)
Mailbox full ,unable to leave message on f/u call 

## 2017-07-12 NOTE — Telephone Encounter (Signed)
  Follow up Call-  Call back number 07/11/2017  Post procedure Call Back phone  # [phone]  Permission to leave phone message Yes  Some recent data might be hidden     Patient questions:  Do you have a fever, pain , or abdominal swelling? No. Pain Score  0 *  Have you tolerated food without any problems? Yes.    Have you been able to return to your normal activities? No.  Do you have any questions about your discharge instructions: Diet   No. Medications  No. Follow up visit  No.  Do you have questions or concerns about your Care? No.  Actions: * If pain score is 4 or above: No action needed, pain <4.

## 2017-07-19 ENCOUNTER — Encounter: Payer: Self-pay | Admitting: Gastroenterology

## 2017-07-22 ENCOUNTER — Telehealth: Payer: Self-pay | Admitting: Podiatry

## 2017-07-22 NOTE — Telephone Encounter (Signed)
Pt came in to pick up orthotics. Insurance has denied claim stating it is routine care.   I gave pt information where insurance was called so he can call and discuss with insurance. Pt picked them up and is aware if insurance does not cover he will be responsible.Pt signed form stating he picked them up.

## 2017-08-31 DIAGNOSIS — J0100 Acute maxillary sinusitis, unspecified: Secondary | ICD-10-CM | POA: Diagnosis not present

## 2017-09-16 DIAGNOSIS — G4733 Obstructive sleep apnea (adult) (pediatric): Secondary | ICD-10-CM | POA: Diagnosis not present

## 2017-10-26 DIAGNOSIS — H15111 Episcleritis periodica fugax, right eye: Secondary | ICD-10-CM | POA: Diagnosis not present

## 2018-01-17 DIAGNOSIS — G4733 Obstructive sleep apnea (adult) (pediatric): Secondary | ICD-10-CM | POA: Diagnosis not present

## 2018-03-21 DIAGNOSIS — Z961 Presence of intraocular lens: Secondary | ICD-10-CM | POA: Diagnosis not present

## 2018-03-21 DIAGNOSIS — H43393 Other vitreous opacities, bilateral: Secondary | ICD-10-CM | POA: Diagnosis not present

## 2018-03-21 DIAGNOSIS — H25042 Posterior subcapsular polar age-related cataract, left eye: Secondary | ICD-10-CM | POA: Diagnosis not present

## 2018-03-23 ENCOUNTER — Encounter: Payer: 59 | Admitting: Internal Medicine

## 2018-04-06 ENCOUNTER — Encounter: Payer: 59 | Admitting: Internal Medicine

## 2018-04-06 DIAGNOSIS — H2512 Age-related nuclear cataract, left eye: Secondary | ICD-10-CM | POA: Diagnosis not present

## 2018-04-13 DIAGNOSIS — H268 Other specified cataract: Secondary | ICD-10-CM | POA: Diagnosis not present

## 2018-04-13 DIAGNOSIS — H2512 Age-related nuclear cataract, left eye: Secondary | ICD-10-CM | POA: Diagnosis not present

## 2018-04-17 ENCOUNTER — Encounter: Payer: 59 | Admitting: Internal Medicine

## 2018-04-18 ENCOUNTER — Ambulatory Visit (INDEPENDENT_AMBULATORY_CARE_PROVIDER_SITE_OTHER): Payer: 59 | Admitting: Internal Medicine

## 2018-04-18 ENCOUNTER — Encounter: Payer: Self-pay | Admitting: Internal Medicine

## 2018-04-18 VITALS — BP 134/80 | HR 78 | Temp 97.6°F | Resp 16 | Ht 72.0 in | Wt 212.2 lb

## 2018-04-18 DIAGNOSIS — N200 Calculus of kidney: Secondary | ICD-10-CM

## 2018-04-18 DIAGNOSIS — Z0000 Encounter for general adult medical examination without abnormal findings: Secondary | ICD-10-CM

## 2018-04-18 NOTE — Assessment & Plan Note (Signed)
-  Td 2011  -Colon cancer screening: Colonoscopy 07/2017, + polyp, per GI letter repeat in 10 years -prostate cancer screening: DRE - PSA wnl 2017.Declined DRE today, check a PSA - (+) FH CAD: plan is to control CVRF - Diet exercise discussed -Labs: UA, PSA, CMP, FLP, CBC, A1c
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
## 2018-04-18 NOTE — Patient Instructions (Addendum)
  GO TO THE FRONT DESK Schedule labs to be done fasting this week  Schedule  your next appointment for a  Physical in 1 year   Check the  blood pressure  monthly  Be sure your blood pressure is between 110/65 and  135/85. If it is consistently higher or lower, let me know

## 2018-04-18 NOTE — Progress Notes (Signed)
Pre visit review using our clinic review tool, if applicable. No additional management support is needed unless otherwise documented below in the visit note. 

## 2018-04-18 NOTE — Assessment & Plan Note (Signed)
Hyperglycemia: Check a A1c HTN: Seems controlled on losartan.  Checking labs GERD, HH: Normal endoscopy 07/2017. OSA: Good CPAP compliance. Kidney stones: Had a left ureteral stone 02-2017.  Subsequently a renal ultrasound was normal however on 03/31/2017 went to the ER withobstructive left kidney stone, it passed spontaneously, since then he is completely asymptomatic.  Recommend to call if he ever has flank pain, blood in the urine.  We are checking the kidney function. RTC 1 year

## 2018-04-19 ENCOUNTER — Other Ambulatory Visit (INDEPENDENT_AMBULATORY_CARE_PROVIDER_SITE_OTHER): Payer: 59

## 2018-04-19 DIAGNOSIS — Z0000 Encounter for general adult medical examination without abnormal findings: Secondary | ICD-10-CM

## 2018-04-19 DIAGNOSIS — N200 Calculus of kidney: Secondary | ICD-10-CM

## 2018-04-19 LAB — COMPREHENSIVE METABOLIC PANEL
ALK PHOS: 84 U/L (ref 39–117)
ALT: 28 U/L (ref 0–53)
AST: 19 U/L (ref 0–37)
Albumin: 4.2 g/dL (ref 3.5–5.2)
BILIRUBIN TOTAL: 0.4 mg/dL (ref 0.2–1.2)
BUN: 12 mg/dL (ref 6–23)
CO2: 29 meq/L (ref 19–32)
Calcium: 9.6 mg/dL (ref 8.4–10.5)
Chloride: 105 mEq/L (ref 96–112)
Creatinine, Ser: 1.11 mg/dL (ref 0.40–1.50)
GFR: 73.69 mL/min (ref >60.00)
GLUCOSE: 100 mg/dL — AB (ref 70–99)
Potassium: 4.4 mEq/L (ref 3.5–5.1)
Sodium: 140 mEq/L (ref 135–145)
TOTAL PROTEIN: 6.8 g/dL (ref 6.0–8.3)

## 2018-04-19 LAB — URINALYSIS, ROUTINE W REFLEX MICROSCOPIC
BILIRUBIN URINE: NEGATIVE
Hgb urine dipstick: NEGATIVE
KETONES UR: NEGATIVE
LEUKOCYTES UA: NEGATIVE
Nitrite: NEGATIVE
PH: 6 (ref 5.0–8.0)
SPECIFIC GRAVITY, URINE: 1.025 (ref 1.000–1.030)
Total Protein, Urine: NEGATIVE
Urine Glucose: NEGATIVE
Urobilinogen, UA: 0.2 (ref 0.0–1.0)

## 2018-04-19 LAB — CBC WITH DIFFERENTIAL/PLATELET
BASOS PCT: 0.3 % (ref 0.0–3.0)
Basophils Absolute: 0 10*3/uL (ref 0.0–0.1)
EOS ABS: 0.2 10*3/uL (ref 0.0–0.7)
Eosinophils Relative: 2.2 % (ref 0.0–5.0)
HEMATOCRIT: 43.4 % (ref 39.0–52.0)
HEMOGLOBIN: 14.8 g/dL (ref 13.0–17.0)
Lymphocytes Relative: 33.6 % (ref 12.0–46.0)
Lymphs Abs: 2.3 10*3/uL (ref 0.7–4.0)
MCHC: 34.2 g/dL (ref 30.0–36.0)
MCV: 89 fl (ref 78.0–100.0)
MONO ABS: 0.9 10*3/uL (ref 0.1–1.0)
Monocytes Relative: 12.5 % — ABNORMAL HIGH (ref 3.0–12.0)
Neutro Abs: 3.6 10*3/uL (ref 1.4–7.7)
Neutrophils Relative %: 51.4 % (ref 43.0–77.0)
PLATELETS: 212 10*3/uL (ref 150.0–400.0)
RBC: 4.87 Mil/uL (ref 4.22–5.81)
RDW: 13.2 % (ref 11.5–15.5)
WBC: 6.9 10*3/uL (ref 4.0–10.5)

## 2018-04-19 LAB — LIPID PANEL
CHOL/HDL RATIO: 4
Cholesterol: 170 mg/dL (ref 0–200)
HDL: 44.7 mg/dL (ref >39.00)
NONHDL: 125.75
TRIGLYCERIDES: 221 mg/dL — AB (ref 0.0–149.0)
VLDL: 44.2 mg/dL — ABNORMAL HIGH (ref 0.0–40.0)

## 2018-04-19 LAB — LDL CHOLESTEROL, DIRECT: LDL DIRECT: 110 mg/dL

## 2018-04-19 LAB — HEMOGLOBIN A1C: Hgb A1c MFr Bld: 6 % (ref 4.6–6.5)

## 2018-04-19 LAB — PSA: PSA: 0.16 ng/mL (ref 0.10–4.00)

## 2018-05-04 ENCOUNTER — Other Ambulatory Visit: Payer: Self-pay | Admitting: Internal Medicine

## 2018-05-04 DIAGNOSIS — G4733 Obstructive sleep apnea (adult) (pediatric): Secondary | ICD-10-CM | POA: Diagnosis not present

## 2018-05-09 ENCOUNTER — Other Ambulatory Visit: Payer: Self-pay | Admitting: Internal Medicine

## 2018-05-09 DIAGNOSIS — K219 Gastro-esophageal reflux disease without esophagitis: Secondary | ICD-10-CM

## 2018-08-08 DIAGNOSIS — G4733 Obstructive sleep apnea (adult) (pediatric): Secondary | ICD-10-CM | POA: Diagnosis not present

## 2018-08-14 DIAGNOSIS — J111 Influenza due to unidentified influenza virus with other respiratory manifestations: Secondary | ICD-10-CM | POA: Diagnosis not present

## 2018-08-24 IMAGING — US US RENAL
1 series · 14 of 25 positions shown · non-contrast
Comparison: CT 02/07/2017.

CLINICAL DATA: Left kidney stone.

EXAM:
RENAL / URINARY TRACT ULTRASOUND COMPLETE

[Series 1: us renal · 0.25mm/px · 14 of 41 slices shown]
[im 1/41]
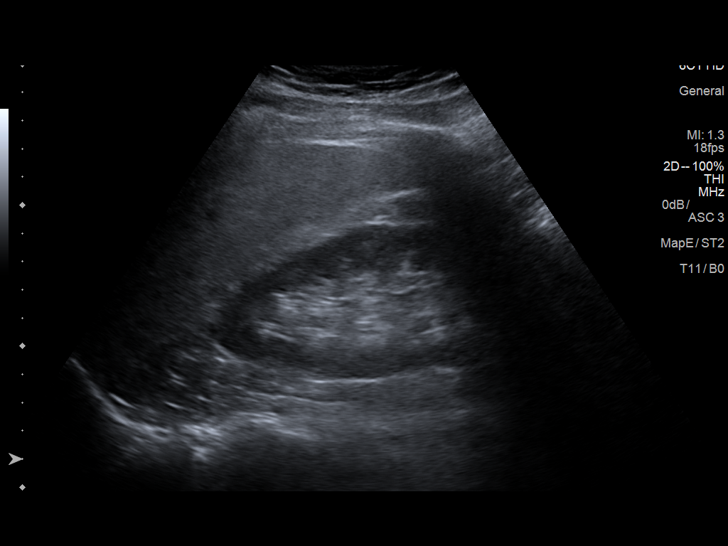
[im 4/41]
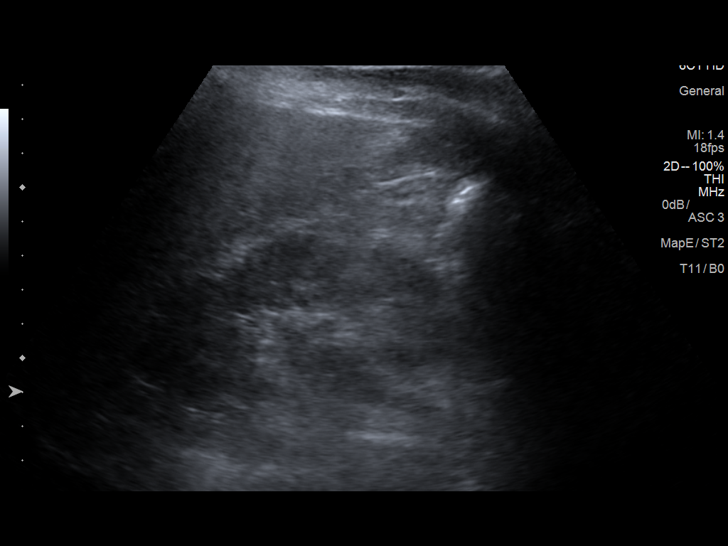
[im 7/41]
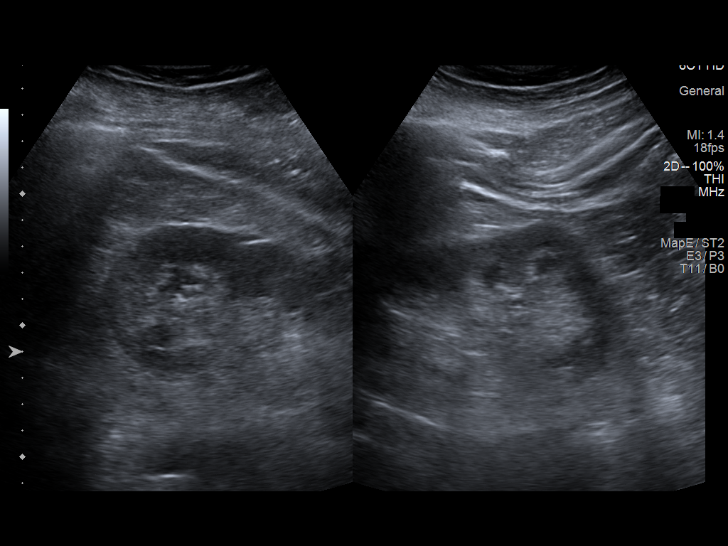
[im 11/41]
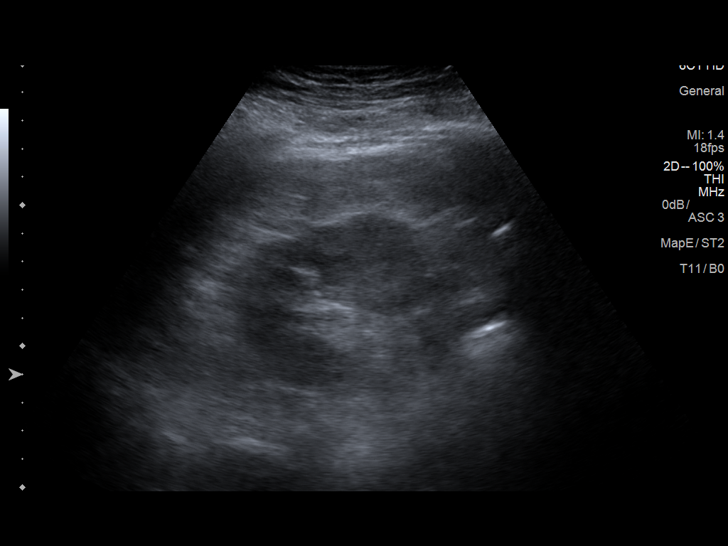
[im 14/41]
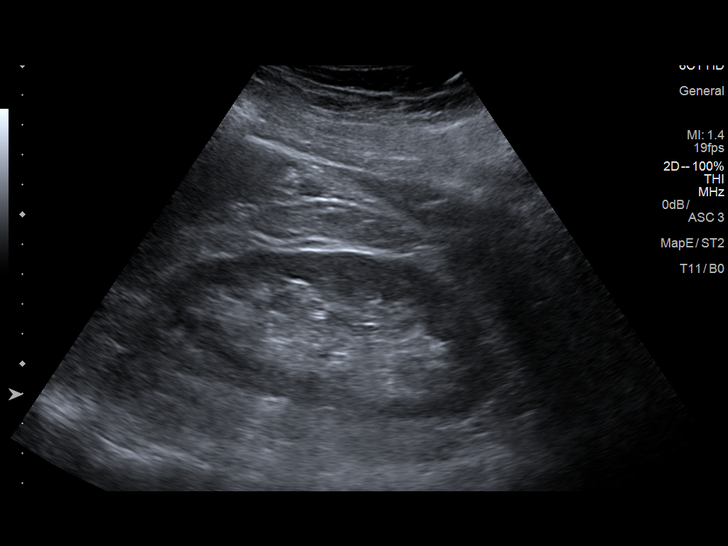
[im 16/41]
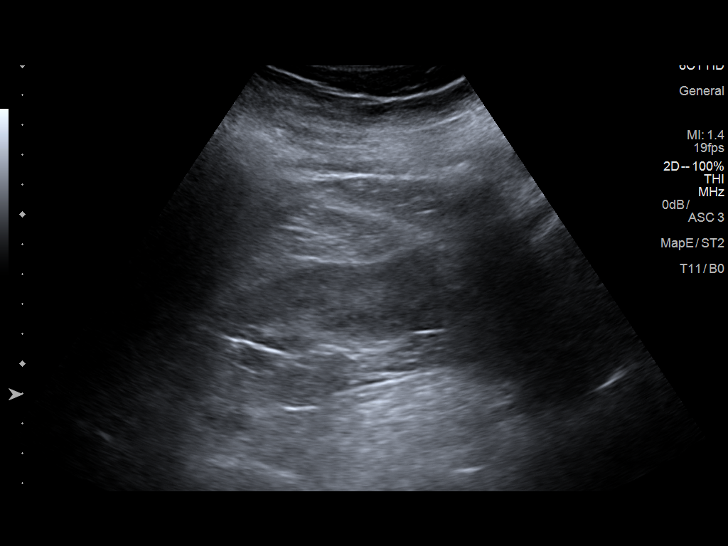
[im 19/41]
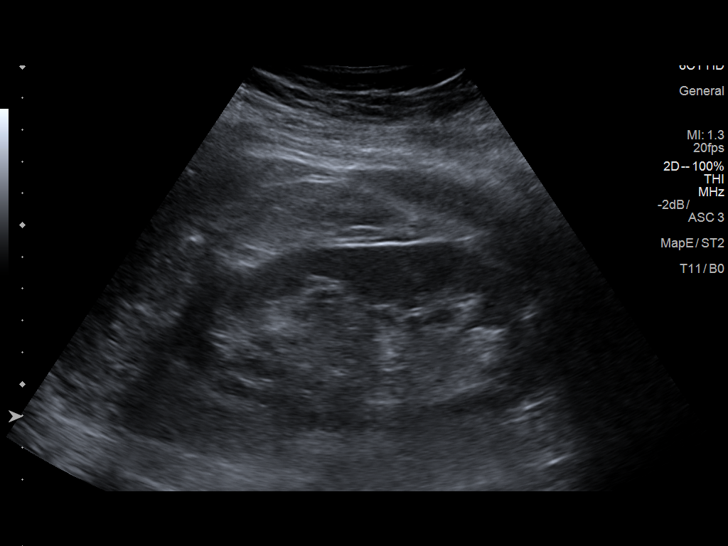
[im 22/41]
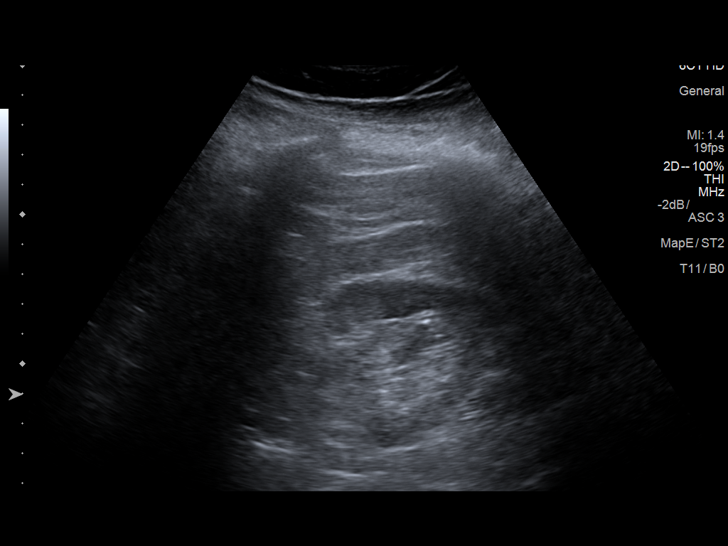
[im 26/41]
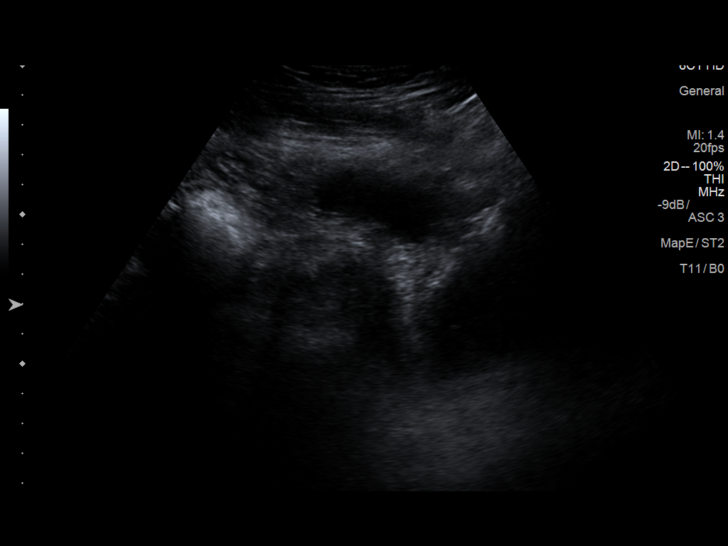
[im 27/41]
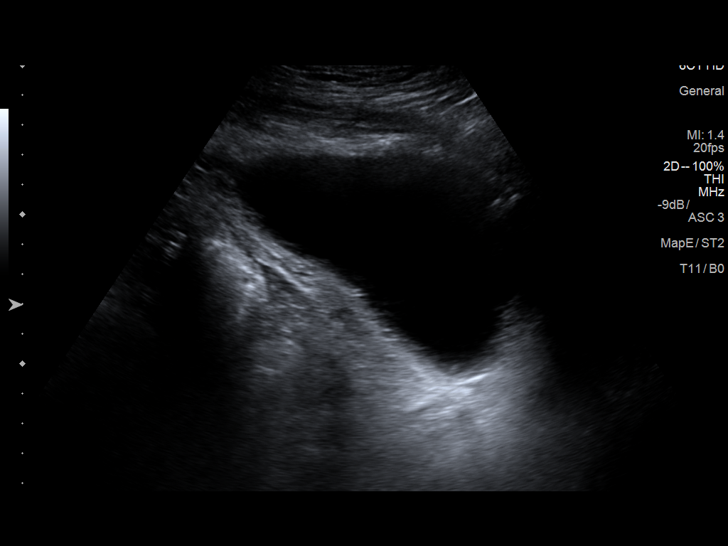
[im 31/41]
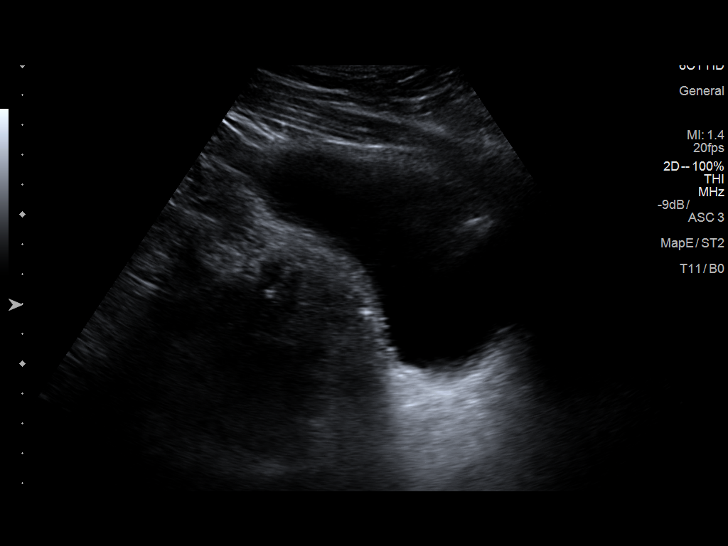
[im 34/41]
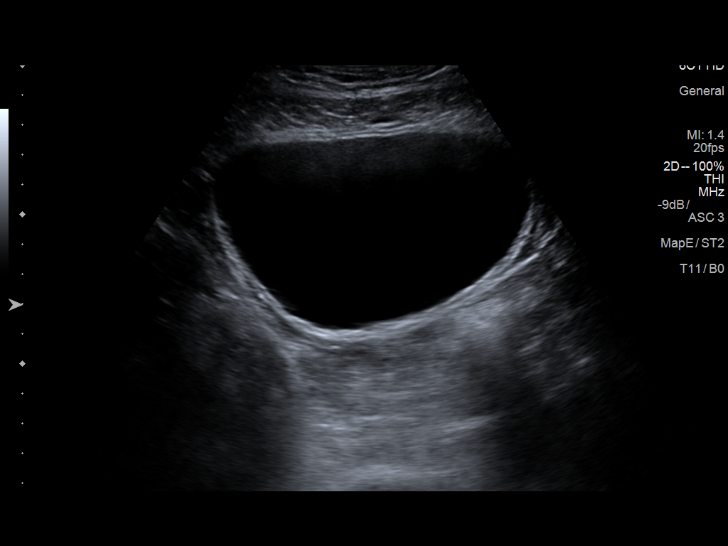
[im 37/41]
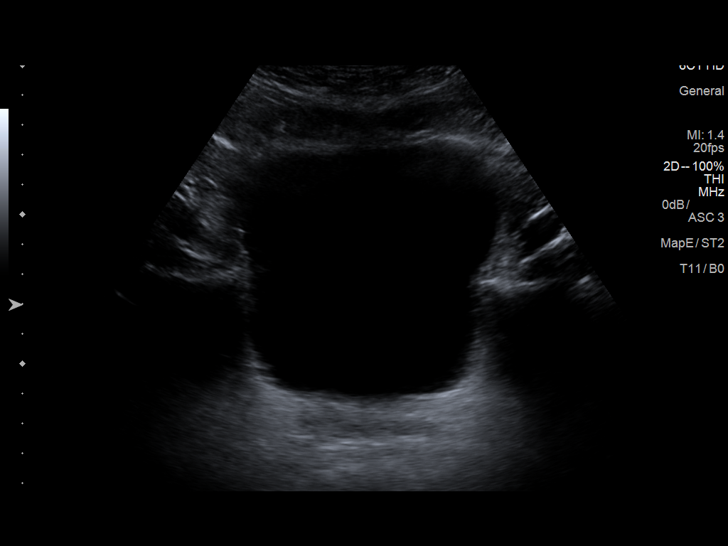
[im 41/41]
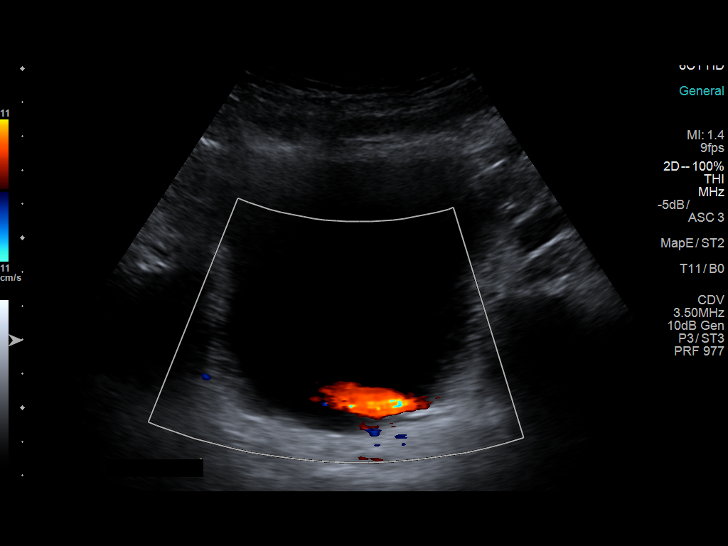

[14 of 25 positions shown; findings below may reference images not displayed]

FINDINGS: Right Kidney:

Length: 11.7 cm. Echogenicity within normal limits. No mass or
hydronephrosis visualized.

Left Kidney:

Length: 11.4 cm. Echogenicity within normal limits. No mass or
hydronephrosis visualized. Interval resolution of left-sided
hydronephrosis noted on prior CT of 02/07/2017.

Bladder:

Appears normal for degree of bladder distention. Bilateral ureteral
jets noted.
IMPRESSION: No acute or focal abnormality identified. No renal stones noted.
Interval resolution of left-sided hydronephrosis noted on CT of
02/07/2017 . Bilateral ureteral jets noted.

## 2018-12-12 DIAGNOSIS — G4733 Obstructive sleep apnea (adult) (pediatric): Secondary | ICD-10-CM | POA: Diagnosis not present

## 2019-04-20 ENCOUNTER — Other Ambulatory Visit: Payer: Self-pay

## 2019-04-23 ENCOUNTER — Encounter: Payer: Self-pay | Admitting: Internal Medicine

## 2019-04-23 ENCOUNTER — Ambulatory Visit (INDEPENDENT_AMBULATORY_CARE_PROVIDER_SITE_OTHER): Payer: 59 | Admitting: Internal Medicine

## 2019-04-23 ENCOUNTER — Other Ambulatory Visit: Payer: Self-pay

## 2019-04-23 VITALS — BP 135/85 | HR 74 | Temp 97.4°F | Resp 16 | Ht 72.0 in | Wt 209.4 lb

## 2019-04-23 DIAGNOSIS — I10 Essential (primary) hypertension: Secondary | ICD-10-CM | POA: Diagnosis not present

## 2019-04-23 DIAGNOSIS — Z0000 Encounter for general adult medical examination without abnormal findings: Secondary | ICD-10-CM | POA: Diagnosis not present

## 2019-04-23 DIAGNOSIS — Z23 Encounter for immunization: Secondary | ICD-10-CM

## 2019-04-23 LAB — COMPREHENSIVE METABOLIC PANEL
ALT: 31 U/L (ref 0–53)
AST: 23 U/L (ref 0–37)
Albumin: 4.4 g/dL (ref 3.5–5.2)
Alkaline Phosphatase: 84 U/L (ref 39–117)
BUN: 14 mg/dL (ref 6–23)
CO2: 27 mEq/L (ref 19–32)
Calcium: 9.4 mg/dL (ref 8.4–10.5)
Chloride: 102 mEq/L (ref 96–112)
Creatinine, Ser: 1.05 mg/dL (ref 0.40–1.50)
GFR: 73.64 mL/min (ref >60.00)
Glucose, Bld: 94 mg/dL (ref 70–99)
Potassium: 4.2 mEq/L (ref 3.5–5.1)
Sodium: 139 mEq/L (ref 135–145)
Total Bilirubin: 0.4 mg/dL (ref 0.2–1.2)
Total Protein: 7 g/dL (ref 6.0–8.3)

## 2019-04-23 LAB — CBC WITH DIFFERENTIAL/PLATELET
Basophils Absolute: 0 10*3/uL (ref 0.0–0.1)
Basophils Relative: 0.4 % (ref 0.0–3.0)
Eosinophils Absolute: 0.1 10*3/uL (ref 0.0–0.7)
Eosinophils Relative: 1.5 % (ref 0.0–5.0)
HCT: 43.7 % (ref 39.0–52.0)
Hemoglobin: 14.9 g/dL (ref 13.0–17.0)
Lymphocytes Relative: 32.2 % (ref 12.0–46.0)
Lymphs Abs: 2.4 10*3/uL (ref 0.7–4.0)
MCHC: 34 g/dL (ref 30.0–36.0)
MCV: 89.5 fl (ref 78.0–100.0)
Monocytes Absolute: 0.9 10*3/uL (ref 0.1–1.0)
Monocytes Relative: 12.3 % — ABNORMAL HIGH (ref 3.0–12.0)
Neutro Abs: 3.9 10*3/uL (ref 1.4–7.7)
Neutrophils Relative %: 53.6 % (ref 43.0–77.0)
Platelets: 205 10*3/uL (ref 150.0–400.0)
RBC: 4.89 Mil/uL (ref 4.22–5.81)
RDW: 12.6 % (ref 11.5–15.5)
WBC: 7.3 10*3/uL (ref 4.0–10.5)

## 2019-04-23 LAB — LIPID PANEL
Cholesterol: 173 mg/dL (ref 0–200)
HDL: 50.1 mg/dL (ref >39.00)
LDL Cholesterol: 89 mg/dL (ref 0–99)
NonHDL: 122.83
Total CHOL/HDL Ratio: 3
Triglycerides: 170 mg/dL — ABNORMAL HIGH (ref 0.0–149.0)
VLDL: 34 mg/dL (ref 0.0–40.0)

## 2019-04-23 LAB — HEMOGLOBIN A1C: Hgb A1c MFr Bld: 6 % (ref 4.6–6.5)
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
## 2019-04-23 NOTE — Patient Instructions (Addendum)
GO TO THE LAB : Get the blood work     GO TO THE FRONT DESK Schedule your next appointment   For a physical in 1 year    Check the  blood pressure   monthly   BP GOAL is between 110/65 and  135/85. If it is consistently higher or lower, let me know

## 2019-04-23 NOTE — Telephone Encounter (Signed)
Opened in error

## 2019-04-23 NOTE — Progress Notes (Signed)
Pre visit review using our clinic review tool, if applicable. No additional management support is needed unless otherwise documented below in the visit note. 

## 2019-04-23 NOTE — Assessment & Plan Note (Signed)
-  Td 2011  -Flu shot today -Colon cancer screening: Colonoscopy 07/2017, + polyp, per GI letter repeat in 10 years -prostate cancer screening: PSA 2019 normal - (+) FH CAD: plan is to control CVRF - Diet exercise discussed.  He is doing well -Labs: CMP, FLP, CBC, A1c

## 2019-04-23 NOTE — Assessment & Plan Note (Signed)
Here for CPX Hyperglycemia, checking A1c, diet discussed HTN: Continue with losartan, Rx as needed, BP today 135/85. GERD: On PPIs, symptoms controlled OSA: Reports good CPAP compliance RTC 1 year

## 2019-05-21 ENCOUNTER — Other Ambulatory Visit: Payer: Self-pay | Admitting: Internal Medicine

## 2019-05-21 DIAGNOSIS — K219 Gastro-esophageal reflux disease without esophagitis: Secondary | ICD-10-CM

## 2019-08-04 ENCOUNTER — Other Ambulatory Visit: Payer: Self-pay | Admitting: Internal Medicine

## 2020-02-27 ENCOUNTER — Ambulatory Visit (INDEPENDENT_AMBULATORY_CARE_PROVIDER_SITE_OTHER): Payer: 59

## 2020-02-27 ENCOUNTER — Other Ambulatory Visit: Payer: Self-pay | Admitting: Podiatry

## 2020-02-27 ENCOUNTER — Ambulatory Visit: Payer: 59 | Admitting: Podiatry

## 2020-02-27 ENCOUNTER — Other Ambulatory Visit: Payer: Self-pay

## 2020-02-27 ENCOUNTER — Encounter: Payer: Self-pay | Admitting: Podiatry

## 2020-02-27 VITALS — Temp 97.8°F

## 2020-02-27 DIAGNOSIS — M79671 Pain in right foot: Secondary | ICD-10-CM | POA: Diagnosis not present

## 2020-02-27 DIAGNOSIS — M779 Enthesopathy, unspecified: Secondary | ICD-10-CM | POA: Diagnosis not present

## 2020-02-27 DIAGNOSIS — G5761 Lesion of plantar nerve, right lower limb: Secondary | ICD-10-CM

## 2020-02-27 DIAGNOSIS — D3610 Benign neoplasm of peripheral nerves and autonomic nervous system, unspecified: Secondary | ICD-10-CM

## 2020-02-27 DIAGNOSIS — M205X1 Other deformities of toe(s) (acquired), right foot: Secondary | ICD-10-CM

## 2020-02-27 DIAGNOSIS — M778 Other enthesopathies, not elsewhere classified: Secondary | ICD-10-CM

## 2020-02-27 NOTE — Patient Instructions (Signed)

## 2020-02-27 NOTE — Progress Notes (Signed)
Subjective:   Patient ID: Danny Henderson, male   DOB: 54 y.o.   MRN: [id number]   HPI Patient presents stating that he feels like he has some kind of nerve pain of the right foot and its been going on for around 3 months.  Also his orthotics worn out from a number years ago and he has some enlargement around the big toe joint bilateral and states he tries to be very active    ROS      Objective:  Physical Exam  Neurovascular status intact with patient's right foot showing positive Danny Henderson sign with radiating discomfort into the adjacent interspaces mild swelling around the MPJ but discomfort that is much less than it between the 2 toes.  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  For the neuroma I did do sterile prep and I utilized a dehydrated purified alcohol solution to try to sclerose the nerve and also see the response short-term.  I discussed hallux limitus I recommended new orthotics and he is casted for functional orthotics at this time and will be seen back to recheck in 2 to 3 weeks and ultimately may require neuroma resection  X-rays indicate that there is some flattening of the first metatarsal head right with the beginning to spur on the right hallux with no other pathology noted

## 2020-03-20 ENCOUNTER — Ambulatory Visit: Payer: 59 | Admitting: Orthotics

## 2020-03-20 ENCOUNTER — Ambulatory Visit (INDEPENDENT_AMBULATORY_CARE_PROVIDER_SITE_OTHER): Payer: 59 | Admitting: Podiatry

## 2020-03-20 ENCOUNTER — Other Ambulatory Visit: Payer: Self-pay

## 2020-03-20 ENCOUNTER — Encounter: Payer: Self-pay | Admitting: Podiatry

## 2020-03-20 VITALS — Temp 98.2°F

## 2020-03-20 DIAGNOSIS — G5761 Lesion of plantar nerve, right lower limb: Secondary | ICD-10-CM

## 2020-03-20 DIAGNOSIS — D3610 Benign neoplasm of peripheral nerves and autonomic nervous system, unspecified: Secondary | ICD-10-CM

## 2020-03-20 DIAGNOSIS — M79671 Pain in right foot: Secondary | ICD-10-CM

## 2020-03-20 DIAGNOSIS — M779 Enthesopathy, unspecified: Secondary | ICD-10-CM

## 2020-03-20 DIAGNOSIS — M205X1 Other deformities of toe(s) (acquired), right foot: Secondary | ICD-10-CM

## 2020-03-20 NOTE — Progress Notes (Signed)
Patient came in today to pick up custom made foot orthotics.  The goals were accomplished and the patient reported no dissatisfaction with said orthotics.  Patient was advised of breakin period and how to report any issues. 

## 2020-03-20 NOTE — Progress Notes (Signed)
Subjective:   Patient ID: Danny Henderson, male   DOB: 54 y.o.   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  Today I went ahead and I am going to continue to focus on the right but I did discuss ultimate surgical intervention for her condition.  I did sterile prep and I injected the third interspace with a purified alcohol Marcaine solution 1.5 cc applied sterile dressing dispensed orthotics we will see how it does over the next 3 weeks and decide whether or not surgical intervention would be necessary in this case

## 2020-04-10 ENCOUNTER — Other Ambulatory Visit: Payer: Self-pay

## 2020-04-10 ENCOUNTER — Encounter: Payer: Self-pay | Admitting: Podiatry

## 2020-04-10 ENCOUNTER — Ambulatory Visit: Payer: 59 | Admitting: Podiatry

## 2020-04-10 DIAGNOSIS — D3610 Benign neoplasm of peripheral nerves and autonomic nervous system, unspecified: Secondary | ICD-10-CM

## 2020-04-10 DIAGNOSIS — M205X1 Other deformities of toe(s) (acquired), right foot: Secondary | ICD-10-CM | POA: Diagnosis not present

## 2020-04-10 NOTE — Progress Notes (Signed)
Subjective:   Patient ID: Danny Henderson, male   DOB: 54 y.o.   MRN: [id number]   HPI Patient presents stating my third interspace right foot is still really bothering me and I think I can have to have something done. Patient states that he had relief for around 6 hours after the last injection with reoccurrence   ROS      Objective:  Physical Exam  Neurovascular status intact with patient found to have positive Biagio Borg sign third interspace right with radiating discomfort into the adjacent digits     Assessment:  Neuroma symptomatology third right still present with pain and shooting discomfort into the adjacent digits with positive Biagio Borg sign     Plan:  H&P reviewed condition failure to respond so far conservatively. I do think neurectomy would be in the patient's best interest and patient wants to have this done and I educated him on this and allowed him to read consent form going over alternative treatments complications. He does understand he will get some numbness between the toes and the total recovery can take 3 to 4 months and there is no long-term guarantees of success. Wants surgery signed consent form is encouraged to call with questions prior to procedure in the next several weeks

## 2020-04-16 ENCOUNTER — Telehealth: Payer: Self-pay

## 2020-04-16 NOTE — Telephone Encounter (Signed)
Danny Henderson called to reshedule his surgery from 04/22/20 to 06/17/20. Notified Cynthia at [facility] and Dr. Paulla Dolly

## 2020-04-24 ENCOUNTER — Encounter: Payer: 59 | Admitting: Internal Medicine

## 2020-04-30 ENCOUNTER — Encounter: Payer: 59 | Admitting: Podiatry

## 2020-05-27 ENCOUNTER — Telehealth: Payer: Self-pay

## 2020-05-27 NOTE — Telephone Encounter (Signed)
DOS 06/17/2020  NEURECTOMY 3RD RT - 28080  [employer] EFFECTIVE DATE - 08/10/2019  PLAN DEDUCTIBLE - $500.00 W/ $0.00 REMAINING OUT OF POCKET - $2500.00 W. $1399.04 REMAINING COPAY $0.00 COINSURANCE - 20%  Notification or Prior Authorization is not required for the requested services  This [employer] plan does not currently require a prior authorization for these services. If you have general questions about the prior authorization requirements, please call us at [phone] or visit [email or web address] > Clinician Resources > Advance and Admission Notification Requirements. The number above acknowledges your notification. Please write this number down for future reference. Notification is not a guarantee of coverage or payment.  Decision ID #:[id number]

## 2020-06-16 HISTORY — PX: EXCISION MORTON'S NEUROMA: SHX5013

## 2020-06-16 MED ORDER — HYDROCODONE-ACETAMINOPHEN 10-325 MG PO TABS: 1.0000 | ORAL_TABLET | Freq: Three times a day (TID) | ORAL | 0 refills | Status: AC | PRN

## 2020-06-16 NOTE — Addendum Note (Signed)
Addended by: Wallene Huh on: 06/16/2020 05:19 PM   Modules accepted: Orders

## 2020-06-17 ENCOUNTER — Encounter: Payer: Self-pay | Admitting: Podiatry

## 2020-06-17 DIAGNOSIS — G5761 Lesion of plantar nerve, right lower limb: Secondary | ICD-10-CM

## 2020-06-20 ENCOUNTER — Telehealth: Payer: Self-pay

## 2020-06-20 ENCOUNTER — Encounter: Payer: Self-pay | Admitting: Podiatry

## 2020-06-20 NOTE — Telephone Encounter (Signed)
Post Op Call: Patient states he is feeling good so far. Patient denies any pain at this time. Patient has not needed to take any of the RX'd pain medication. Patient denies fever, chills, nausea/vomiting, SOB and tightness in the calf. Patient confirms bandage is clean, dry and intact. Denies excessive tightness, bleeding or drainage from the bandage. Patient confirms understanding the post op instructions. Patient did not have any questions at this time. Follow-up appointment confirmed 06/25/20 at 215pm.

## 2020-06-23 ENCOUNTER — Other Ambulatory Visit: Payer: Self-pay | Admitting: Internal Medicine

## 2020-06-23 DIAGNOSIS — K219 Gastro-esophageal reflux disease without esophagitis: Secondary | ICD-10-CM

## 2020-06-25 ENCOUNTER — Ambulatory Visit (INDEPENDENT_AMBULATORY_CARE_PROVIDER_SITE_OTHER): Payer: 59 | Admitting: Podiatry

## 2020-06-25 ENCOUNTER — Encounter: Payer: Self-pay | Admitting: Podiatry

## 2020-06-25 ENCOUNTER — Telehealth: Payer: Self-pay

## 2020-06-25 ENCOUNTER — Other Ambulatory Visit: Payer: Self-pay

## 2020-06-25 DIAGNOSIS — K219 Gastro-esophageal reflux disease without esophagitis: Secondary | ICD-10-CM

## 2020-06-25 DIAGNOSIS — D3610 Benign neoplasm of peripheral nerves and autonomic nervous system, unspecified: Secondary | ICD-10-CM | POA: Diagnosis not present

## 2020-06-25 MED ORDER — DEXILANT 60 MG PO CPDR: 60.0000 mg | DELAYED_RELEASE_CAPSULE | Freq: Every day | ORAL | 3 refills | Status: DC

## 2020-06-25 NOTE — Telephone Encounter (Signed)
Pt called stating [facility] never received the escribed refill on Dexilant 60MG  capsule on 06/23/20.  Please re-send it to the [facility] at [place].

## 2020-06-25 NOTE — Telephone Encounter (Signed)
Rx resent.

## 2020-06-25 NOTE — Progress Notes (Signed)
Subjective:   Patient ID: Danny Henderson, male   DOB: 54 y.o.   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

## 2020-08-04 ENCOUNTER — Encounter: Payer: Self-pay | Admitting: Internal Medicine

## 2020-08-04 ENCOUNTER — Ambulatory Visit (INDEPENDENT_AMBULATORY_CARE_PROVIDER_SITE_OTHER): Payer: 59 | Admitting: Internal Medicine

## 2020-08-04 ENCOUNTER — Other Ambulatory Visit: Payer: Self-pay

## 2020-08-04 VITALS — BP 150/100 | HR 81 | Temp 98.0°F | Resp 16 | Ht 72.0 in | Wt 212.5 lb

## 2020-08-04 DIAGNOSIS — Z23 Encounter for immunization: Secondary | ICD-10-CM | POA: Diagnosis not present

## 2020-08-04 DIAGNOSIS — Z0000 Encounter for general adult medical examination without abnormal findings: Secondary | ICD-10-CM | POA: Diagnosis not present

## 2020-08-04 DIAGNOSIS — I10 Essential (primary) hypertension: Secondary | ICD-10-CM

## 2020-08-04 DIAGNOSIS — Z125 Encounter for screening for malignant neoplasm of prostate: Secondary | ICD-10-CM

## 2020-08-04 DIAGNOSIS — R739 Hyperglycemia, unspecified: Secondary | ICD-10-CM | POA: Diagnosis not present

## 2020-08-04 LAB — COMPREHENSIVE METABOLIC PANEL
ALT: 37 U/L (ref 0–53)
AST: 22 U/L (ref 0–37)
Albumin: 4.6 g/dL (ref 3.5–5.2)
Alkaline Phosphatase: 78 U/L (ref 39–117)
BUN: 14 mg/dL (ref 6–23)
CO2: 28 mEq/L (ref 19–32)
Calcium: 9.4 mg/dL (ref 8.4–10.5)
Chloride: 103 mEq/L (ref 96–112)
Creatinine, Ser: 1.06 mg/dL (ref 0.40–1.50)
GFR: 79.2 mL/min (ref >60.00)
Glucose, Bld: 89 mg/dL (ref 70–99)
Potassium: 4.5 mEq/L (ref 3.5–5.1)
Sodium: 138 mEq/L (ref 135–145)
Total Bilirubin: 0.5 mg/dL (ref 0.2–1.2)
Total Protein: 7.3 g/dL (ref 6.0–8.3)

## 2020-08-04 LAB — CBC WITH DIFFERENTIAL/PLATELET
Basophils Absolute: 0 10*3/uL (ref 0.0–0.1)
Basophils Relative: 0.2 % (ref 0.0–3.0)
Eosinophils Absolute: 0.1 10*3/uL (ref 0.0–0.7)
Eosinophils Relative: 0.8 % (ref 0.0–5.0)
HCT: 46.4 % (ref 39.0–52.0)
Hemoglobin: 15.5 g/dL (ref 13.0–17.0)
Lymphocytes Relative: 32.1 % (ref 12.0–46.0)
Lymphs Abs: 2.5 10*3/uL (ref 0.7–4.0)
MCHC: 33.5 g/dL (ref 30.0–36.0)
MCV: 89.4 fl (ref 78.0–100.0)
Monocytes Absolute: 0.9 10*3/uL (ref 0.1–1.0)
Monocytes Relative: 11.1 % (ref 3.0–12.0)
Neutro Abs: 4.3 10*3/uL (ref 1.4–7.7)
Neutrophils Relative %: 55.8 % (ref 43.0–77.0)
Platelets: 213 10*3/uL (ref 150.0–400.0)
RBC: 5.19 Mil/uL (ref 4.22–5.81)
RDW: 13 % (ref 11.5–15.5)
WBC: 7.7 10*3/uL (ref 4.0–10.5)

## 2020-08-04 LAB — TSH: TSH: 2.5 u[IU]/mL (ref 0.35–4.50)

## 2020-08-04 LAB — LIPID PANEL
Cholesterol: 205 mg/dL — ABNORMAL HIGH (ref 0–200)
HDL: 47.9 mg/dL (ref >39.00)
LDL Cholesterol: 118 mg/dL — ABNORMAL HIGH (ref 0–99)
NonHDL: 156.76
Total CHOL/HDL Ratio: 4
Triglycerides: 193 mg/dL — ABNORMAL HIGH (ref 0.0–149.0)
VLDL: 38.6 mg/dL (ref 0.0–40.0)

## 2020-08-04 LAB — PSA: PSA: 0.19 ng/mL (ref 0.10–4.00)

## 2020-08-04 LAB — HEMOGLOBIN A1C: Hgb A1c MFr Bld: 6 % (ref 4.6–6.5)

## 2020-08-04 MED ORDER — LOSARTAN POTASSIUM 50 MG PO TABS: ORAL_TABLET | ORAL | 1 refills | Status: DC

## 2020-08-04 NOTE — Patient Instructions (Addendum)
Restart losartan  Watch your salt intake, go back to a healthier diet  Check the  blood pressure weekly. BP GOAL is between 110/65 and  135/85. If it is consistently higher or lower, let me know  GO TO THE LAB : Get the blood work     [place], [place] back for blood work in 2 weeks, no need to be fasting  Come back for a checkup in 4 months
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
## 2020-08-04 NOTE — Assessment & Plan Note (Signed)
-  Td 08/04/2020 -Had 2 Covid vaccines, booster due 11-2020. -Flu shot today -Colon cancer screening:Colonoscopy 07/2017, + polyp, per GI letter repeat in 10 years -prostate cancer screening: DRE normal, check a PSA.   - (+)FH CAD: plan is to control CVRF - Dietexercise discussed.  He is doing well -Labs: CMP, FLP, CBC, A1c, TSH, PSA

## 2020-08-04 NOTE — Progress Notes (Signed)
Pre visit review using our clinic review tool, if applicable. No additional management support is needed unless otherwise documented below in the visit note. 

## 2020-08-04 NOTE — Assessment & Plan Note (Signed)
Here for CPX Hyperglycemia: Check A1c, admits he is not eating healthy, not exercising (due to recent foot surgery).  Encouraged to go back to a healthier lifestyle as soon as he can. HTN: Reports that he has not been taking losartan for a while because BPs were very good.  BP today is elevated.  Plan: Restart losartan, BMP in 2 weeks, monitor BPs. OSA: Continue with CPAP. RTC 4 months

## 2020-08-14 ENCOUNTER — Ambulatory Visit (INDEPENDENT_AMBULATORY_CARE_PROVIDER_SITE_OTHER): Payer: 59

## 2020-08-14 ENCOUNTER — Ambulatory Visit (INDEPENDENT_AMBULATORY_CARE_PROVIDER_SITE_OTHER): Payer: 59 | Admitting: Podiatry

## 2020-08-14 ENCOUNTER — Encounter: Payer: Self-pay | Admitting: Podiatry

## 2020-08-14 ENCOUNTER — Other Ambulatory Visit: Payer: Self-pay

## 2020-08-14 DIAGNOSIS — M779 Enthesopathy, unspecified: Secondary | ICD-10-CM

## 2020-08-14 DIAGNOSIS — M205X1 Other deformities of toe(s) (acquired), right foot: Secondary | ICD-10-CM | POA: Diagnosis not present

## 2020-08-14 NOTE — Progress Notes (Signed)
Subjective:   Patient ID: Danny Henderson, male   DOB: 55 y.o.   MRN: [id number]   HPI Patient states still having pain in the right third interspace and I still feel a click in that area   ROS      Objective:  Physical Exam  Neurovascular status intact with patient found to have a slight click still third interspace right with a history of having what appeared to be more of a ganglionic versus a neuroma having been resected several months ago     Assessment:  Possibility we may still be dealing with some chronic nerve entrapment in the area versus an inflammatory condition     Plan:  H&P education condition rendered and rex-ray discussed.  I went ahead today did sterile prep and injected the third MPJ and slightly proximal 3 mg Dexasone Kenalog 5 mg Xylocaine and discussed the possibility we may need to do a plantar approach if symptoms were to persist over the next extended period of time  X-rays were negative for signs of fracture or bony injury associated with this condition

## 2020-08-19 ENCOUNTER — Other Ambulatory Visit: Payer: Self-pay

## 2020-08-20 ENCOUNTER — Other Ambulatory Visit (INDEPENDENT_AMBULATORY_CARE_PROVIDER_SITE_OTHER): Payer: 59

## 2020-08-20 DIAGNOSIS — I10 Essential (primary) hypertension: Secondary | ICD-10-CM | POA: Diagnosis not present

## 2020-08-20 LAB — BASIC METABOLIC PANEL
BUN: 16 mg/dL (ref 6–23)
CO2: 28 mEq/L (ref 19–32)
Calcium: 9.5 mg/dL (ref 8.4–10.5)
Chloride: 102 mEq/L (ref 96–112)
Creatinine, Ser: 1 mg/dL (ref 0.40–1.50)
GFR: 84.91 mL/min (ref >60.00)
Glucose, Bld: 110 mg/dL — ABNORMAL HIGH (ref 70–99)
Potassium: 4.4 mEq/L (ref 3.5–5.1)
Sodium: 138 mEq/L (ref 135–145)

## 2020-08-21 MED ORDER — LOSARTAN POTASSIUM 50 MG PO TABS: 50.0000 mg | ORAL_TABLET | Freq: Every day | ORAL | 3 refills | Status: DC

## 2020-08-21 NOTE — Addendum Note (Signed)
Addended byDamita Dunnings D on: 08/21/2020 02:19 PM   Modules accepted: Orders

## 2020-08-22 ENCOUNTER — Other Ambulatory Visit: Payer: Self-pay | Admitting: Podiatry

## 2020-08-22 DIAGNOSIS — M779 Enthesopathy, unspecified: Secondary | ICD-10-CM

## 2020-08-22 DIAGNOSIS — M205X1 Other deformities of toe(s) (acquired), right foot: Secondary | ICD-10-CM

## 2020-10-06 ENCOUNTER — Encounter: Payer: Self-pay | Admitting: Neurology

## 2020-10-08 ENCOUNTER — Telehealth: Payer: Self-pay | Admitting: Internal Medicine

## 2020-10-08 DIAGNOSIS — G4733 Obstructive sleep apnea (adult) (pediatric): Secondary | ICD-10-CM

## 2020-10-08 NOTE — Telephone Encounter (Signed)
Patient would like a referral to Dr. Brett Fairy in order to get a new cpad machine

## 2020-10-08 NOTE — Telephone Encounter (Signed)
Referral placed.

## 2020-10-16 ENCOUNTER — Encounter: Payer: Self-pay | Admitting: Podiatry

## 2020-10-16 ENCOUNTER — Ambulatory Visit: Payer: 59 | Admitting: Podiatry

## 2020-10-16 ENCOUNTER — Other Ambulatory Visit: Payer: Self-pay

## 2020-10-16 DIAGNOSIS — D3610 Benign neoplasm of peripheral nerves and autonomic nervous system, unspecified: Secondary | ICD-10-CM

## 2020-10-16 DIAGNOSIS — M779 Enthesopathy, unspecified: Secondary | ICD-10-CM | POA: Diagnosis not present

## 2020-10-16 NOTE — Progress Notes (Signed)
Subjective:   Patient ID: Danny Henderson, male   DOB: 55 y.o.   MRN: [id number]   HPI Patient presents stating I am still having that feeling between my 2 toes and its not getting better.  States it is very sore and I only had relief for short period of time after surgery   ROS      Objective:  Physical Exam  Neurovascular status intact with a clicking-like sensation third interspace right that may be a reoccurrence of cyst or possible residual nerve tissue     Assessment:  Possibility for residual neuroma or possibility for recurrent cyst that had been removed last year     Plan:  H&P reviewed condition and due to the fact that this is still giving him this problem I do think will get a need to go back in there and attempt to remove all tissue with the hope that that will solve any remaining problem.  I am upset about this and it is unfortunate but I do not see another option for him and it is possible that the cyst is regrown in that area.  Patient wants to have this procedure done and I reviewed what would be required and patient is scheduled for outpatient surgery and signed consent form absolutely understanding there is no guarantee this will solve the problem.  Patient encouraged to call with questions concerns which may arise.  He is can of present before then to have 1 steroid injection to try to give him temporary relief before he goes on a trip to [place] [place]

## 2020-11-05 ENCOUNTER — Encounter: Payer: Self-pay | Admitting: Neurology

## 2020-11-05 ENCOUNTER — Ambulatory Visit: Payer: 59 | Admitting: Neurology

## 2020-11-05 ENCOUNTER — Telehealth: Payer: Self-pay | Admitting: Urology

## 2020-11-05 VITALS — BP 147/90 | HR 84 | Ht 72.0 in | Wt 215.0 lb

## 2020-11-05 DIAGNOSIS — K219 Gastro-esophageal reflux disease without esophagitis: Secondary | ICD-10-CM | POA: Diagnosis not present

## 2020-11-05 DIAGNOSIS — G4733 Obstructive sleep apnea (adult) (pediatric): Secondary | ICD-10-CM | POA: Diagnosis not present

## 2020-11-05 DIAGNOSIS — Z9989 Dependence on other enabling machines and devices: Secondary | ICD-10-CM

## 2020-11-05 DIAGNOSIS — J301 Allergic rhinitis due to pollen: Secondary | ICD-10-CM | POA: Diagnosis not present

## 2020-11-05 NOTE — Patient Instructions (Signed)
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
## 2020-11-05 NOTE — Telephone Encounter (Signed)
DOS: 11/18/20   NEURECTOMY RIGHT --- [address]  [employer] EFFECTIVE DATE - 08/09/20  PLAN DEDUCTIBLE - $500.00 W/ $297.72 REMAINING OUT OF POCKET - $2,500.00 W/ $6,950.72 REMAINING COPAY - $0.00 COINSURANCE - 20%   PER UHC WEBSITE CPT CODE 25750 Notification or Prior Authorization is not required for the requested services  Decision ID #:[id number]

## 2020-11-06 ENCOUNTER — Ambulatory Visit: Payer: 59 | Admitting: Podiatry

## 2020-11-06 ENCOUNTER — Other Ambulatory Visit: Payer: Self-pay

## 2020-11-06 ENCOUNTER — Encounter: Payer: Self-pay | Admitting: Podiatry

## 2020-11-06 DIAGNOSIS — D3610 Benign neoplasm of peripheral nerves and autonomic nervous system, unspecified: Secondary | ICD-10-CM

## 2020-11-06 NOTE — Progress Notes (Signed)
Subjective:   Patient ID: Danny Henderson, male   DOB: 55 y.o.   MRN: [id number]   HPI Patient states he is getting ready go on vacation would like some relief from his symptoms before he would consider surgery   ROS      Objective:  Physical Exam  Neurovascular status intact with inflammation pain third interspace right foot with having had previous neuroma surgery but appears to have some form of reoccurrence or possibly a residual amount of neuroma tissue with inflammation     Assessment:  Probability for some form of reoccurrence neuroma or neuroma that was obliterated by a ganglion that had been removed     Plan:  Discussed with patient he is scheduled for surgery and will get a try 1 more conservative treatment I did do sterile prep I injected with combination dexamethasone Kenalog 5 mg 10 mg Xylocaine he will see results and if improved we may hold off on surgery if not he will require surgical exploration of the area

## 2020-11-11 ENCOUNTER — Telehealth: Payer: Self-pay

## 2020-11-11 NOTE — Telephone Encounter (Signed)
Dorean called to cancel his surgery with Dr. Paulla Dolly on 11/18/2020. He stated that his foot is doing better and he'd like to wait. He requested a follow up with Dr. Paulla Dolly in May. Scheduled him on 12/15/2020. Notified Dr. Paulla Dolly and Caren Griffins with [facility].

## 2020-11-24 ENCOUNTER — Encounter: Payer: 59 | Admitting: Podiatry

## 2020-12-03 ENCOUNTER — Encounter: Payer: Self-pay | Admitting: Internal Medicine

## 2020-12-03 ENCOUNTER — Ambulatory Visit: Payer: 59 | Admitting: Internal Medicine

## 2020-12-03 ENCOUNTER — Other Ambulatory Visit: Payer: Self-pay

## 2020-12-03 VITALS — BP 136/100 | HR 79 | Temp 97.9°F | Resp 16 | Ht 72.0 in | Wt 214.1 lb

## 2020-12-03 DIAGNOSIS — I10 Essential (primary) hypertension: Secondary | ICD-10-CM

## 2020-12-03 DIAGNOSIS — J302 Other seasonal allergic rhinitis: Secondary | ICD-10-CM

## 2020-12-03 DIAGNOSIS — Z7185 Encounter for immunization safety counseling: Secondary | ICD-10-CM

## 2020-12-03 DIAGNOSIS — R739 Hyperglycemia, unspecified: Secondary | ICD-10-CM

## 2020-12-03 MED ORDER — PANTOPRAZOLE SODIUM 40 MG PO TBEC: 40.0000 mg | DELAYED_RELEASE_TABLET | Freq: Every day | ORAL | 3 refills | Status: DC

## 2020-12-03 NOTE — Patient Instructions (Signed)
Check the  blood pressure  weekly   BP GOAL is between 110/65 and  135/85. If it is consistently higher or lower, let me know   For allergies: Continue Claritin Continue Flonase Eyedrops: Patanol, they are over-the-counter. Call if no better  For acid reflux you can switch to pantoprazole, 1 every morning before breakfast, were sending a prescription  [place], [place] back for a physical exam by December
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
## 2020-12-04 NOTE — Assessment & Plan Note (Signed)
HTN: Ambulatory BPs 110-120s/80s, on losartan, last BMP satisfactory, BP today is a slightly elevated he has been taking Claritin-D for 2 days.  Plan: No change. Seasonal allergies: On Claritin, Flonase, has taken Claritin-D for 2 days.  Recommend to go back to regular Claritin and add Patanol.  Consider Singulair if no better.  Patient anticipates symptoms will decrease soon as the pollen season ends. Hyperglycemia: Diet controlled, doing well, exercising more.  CV RF at 10 years is 7.7%. Preventive care: Extensive discussion about benefits of COVID-vaccine #3. RTC 07/2021 CPX

## 2020-12-15 ENCOUNTER — Ambulatory Visit: Payer: 59 | Admitting: Podiatry

## 2020-12-15 ENCOUNTER — Other Ambulatory Visit: Payer: Self-pay

## 2020-12-15 ENCOUNTER — Encounter: Payer: Self-pay | Admitting: Podiatry

## 2020-12-15 DIAGNOSIS — M779 Enthesopathy, unspecified: Secondary | ICD-10-CM

## 2020-12-15 DIAGNOSIS — D3610 Benign neoplasm of peripheral nerves and autonomic nervous system, unspecified: Secondary | ICD-10-CM | POA: Diagnosis not present

## 2020-12-17 NOTE — Progress Notes (Signed)
Subjective:   Patient ID: Danny Henderson, male   DOB: 54 y.o.   MRN: [id number]   HPI Patient states he feels like the problem is back to the way it was and he knows that he is going to need surgery on this but he like to wait several months and wanted to check   ROS      Objective:  Physical Exam  Neurovascular status intact with what appears to be some possible reoccurrence neuroma or stump neuroma right third intermetatarsal space     Assessment:  Probability for neuroma symptomatology right difficult to say with previous resection helping to a degree     Plan:  H&P reviewed I recommended the continuation of conservative care wide shoe gear and if anything were to occur to let us know immediately with surgery to be done based on his schedule

## 2021-02-19 ENCOUNTER — Encounter (HOSPITAL_COMMUNITY)
Admission: AD | Disposition: A | Discharge status: Home / Self Care | Payer: Self-pay | Source: Ambulatory Visit | Attending: Urology

## 2021-02-19 ENCOUNTER — Other Ambulatory Visit: Payer: Self-pay | Admitting: Urology

## 2021-02-19 ENCOUNTER — Inpatient Hospital Stay (HOSPITAL_COMMUNITY): Payer: 59

## 2021-02-19 ENCOUNTER — Encounter (HOSPITAL_COMMUNITY): Payer: Self-pay | Admitting: Urology

## 2021-02-19 ENCOUNTER — Ambulatory Visit (HOSPITAL_COMMUNITY)
Admission: AD | Admit: 2021-02-19 | Discharge: 2021-02-19 | Disposition: A | Discharge status: Home / Self Care | Payer: 59 | Source: Ambulatory Visit | Attending: Urology | Admitting: Urology

## 2021-02-19 ENCOUNTER — Inpatient Hospital Stay (HOSPITAL_COMMUNITY): Payer: 59 | Admitting: Anesthesiology

## 2021-02-19 ENCOUNTER — Telehealth: Payer: Self-pay | Admitting: Internal Medicine

## 2021-02-19 DIAGNOSIS — R319 Hematuria, unspecified: Secondary | ICD-10-CM

## 2021-02-19 DIAGNOSIS — N132 Hydronephrosis with renal and ureteral calculous obstruction: Secondary | ICD-10-CM | POA: Diagnosis not present

## 2021-02-19 DIAGNOSIS — Z87442 Personal history of urinary calculi: Secondary | ICD-10-CM

## 2021-02-19 DIAGNOSIS — Z79899 Other long term (current) drug therapy: Secondary | ICD-10-CM | POA: Diagnosis not present

## 2021-02-19 HISTORY — DX: Personal history of urinary calculi: Z87.442

## 2021-02-19 HISTORY — PX: CYSTOSCOPY W/ URETERAL STENT PLACEMENT: SHX1429

## 2021-02-19 LAB — BASIC METABOLIC PANEL
Anion gap: 9 (ref 5–15)
BUN: 14 mg/dL (ref 6–20)
CO2: 23 mmol/L (ref 22–32)
Calcium: 9.2 mg/dL (ref 8.9–10.3)
Chloride: 108 mmol/L (ref 98–111)
Creatinine, Ser: 1.06 mg/dL (ref 0.61–1.24)
GFR, Estimated: >60 mL/min (ref >60)
Glucose, Bld: 96 mg/dL (ref 70–99)
Potassium: 3.6 mmol/L (ref 3.5–5.1)
Sodium: 140 mmol/L (ref 135–145)

## 2021-02-19 LAB — CBC
HCT: 44.2 % (ref 39.0–52.0)
Hemoglobin: 14.8 g/dL (ref 13.0–17.0)
MCH: 30 pg (ref 26.0–34.0)
MCHC: 33.5 g/dL (ref 30.0–36.0)
MCV: 89.5 fL (ref 80.0–100.0)
Platelets: 208 10*3/uL (ref 150–400)
RBC: 4.94 MIL/uL (ref 4.22–5.81)
RDW: 12.5 % (ref 11.5–15.5)
WBC: 8.6 10*3/uL (ref 4.0–10.5)
nRBC: 0 % (ref 0.0–0.2)

## 2021-02-19 SURGERY — CYSTOSCOPY, WITH RETROGRADE PYELOGRAM AND URETERAL STENT INSERTION
Anesthesia: General | Laterality: Bilateral

## 2021-02-19 MED ORDER — TRAMADOL HCL 50 MG PO TABS: 50.0000 mg | ORAL_TABLET | Freq: Four times a day (QID) | ORAL | 0 refills | Status: AC | PRN

## 2021-02-19 MED ORDER — OXYCODONE HCL 5 MG/5ML PO SOLN
5.0000 mg | Freq: Once | ORAL | Status: AC | PRN
Start: 2021-02-19 — End: 2021-02-19

## 2021-02-19 MED ORDER — FENTANYL CITRATE (PF) 100 MCG/2ML IJ SOLN
INTRAMUSCULAR | Status: DC | PRN
  Administered 2021-02-19: 100 ug via INTRAVENOUS

## 2021-02-19 MED ORDER — GENTAMICIN SULFATE 40 MG/ML IJ SOLN
5.0000 mg/kg | INTRAVENOUS | Status: AC
  Administered 2021-02-19: 490 mg via INTRAVENOUS
  Filled 2021-02-19: qty 12.25

## 2021-02-19 MED ORDER — SODIUM CHLORIDE 0.9 % IR SOLN
Status: DC | PRN
  Administered 2021-02-19: 3000 mL

## 2021-02-19 MED ORDER — IOHEXOL 300 MG/ML  SOLN
INTRAMUSCULAR | Status: DC | PRN
  Administered 2021-02-19: 10 mL

## 2021-02-19 MED ORDER — ACETAMINOPHEN 500 MG PO TABS
1000.0000 mg | ORAL_TABLET | Freq: Once | ORAL | Status: AC
  Administered 2021-02-19: 1000 mg via ORAL
  Filled 2021-02-19 (×2): qty 2

## 2021-02-19 MED ORDER — CHLORHEXIDINE GLUCONATE 0.12 % MT SOLN: 15.0000 mL | Freq: Once | OROMUCOSAL | Status: DC

## 2021-02-19 MED ORDER — OXYCODONE HCL 5 MG PO TABS
5.0000 mg | ORAL_TABLET | Freq: Once | ORAL | Status: AC | PRN
  Administered 2021-02-19: 5 mg via ORAL
  Filled 2021-02-19: qty 1

## 2021-02-19 MED ORDER — LIDOCAINE HCL (CARDIAC) PF 100 MG/5ML IV SOSY
PREFILLED_SYRINGE | INTRAVENOUS | Status: DC | PRN
  Administered 2021-02-19: 60 mg via INTRAVENOUS

## 2021-02-19 MED ORDER — PROPOFOL 10 MG/ML IV BOLUS
INTRAVENOUS | Status: DC | PRN
  Administered 2021-02-19: 200 mg via INTRAVENOUS

## 2021-02-19 MED ORDER — TAMSULOSIN HCL 0.4 MG PO CAPS: 0.4000 mg | ORAL_CAPSULE | Freq: Every day | ORAL | 0 refills | Status: AC

## 2021-02-19 MED ORDER — HYDROMORPHONE HCL 1 MG/ML IJ SOLN: 0.2500 mg | INTRAMUSCULAR | Status: DC | PRN

## 2021-02-19 MED ORDER — DEXAMETHASONE SODIUM PHOSPHATE 4 MG/ML IJ SOLN
INTRAMUSCULAR | Status: DC | PRN
  Administered 2021-02-19: 8 mg via INTRAVENOUS

## 2021-02-19 MED ORDER — MIDAZOLAM HCL 5 MG/5ML IJ SOLN
INTRAMUSCULAR | Status: DC | PRN
  Administered 2021-02-19: 2 mg via INTRAVENOUS

## 2021-02-19 MED ORDER — ONDANSETRON HCL 4 MG/2ML IJ SOLN
INTRAMUSCULAR | Status: DC | PRN
  Administered 2021-02-19: 4 mg via INTRAVENOUS

## 2021-02-19 MED ORDER — PROMETHAZINE HCL 25 MG/ML IJ SOLN: 6.2500 mg | INTRAMUSCULAR | Status: DC | PRN

## 2021-02-19 MED ORDER — LACTATED RINGERS IV SOLN: INTRAVENOUS | Status: DC

## 2021-02-19 SURGICAL SUPPLY — 14 items
BAG URO CATCHER STRL LF (MISCELLANEOUS) ×2 IMPLANT
CATH URET 5FR 28IN OPEN ENDED (CATHETERS) ×1 IMPLANT
CLOTH BEACON ORANGE TIMEOUT ST (SAFETY) ×2 IMPLANT
GLOVE SURG ENC TEXT LTX SZ7 (GLOVE) ×2 IMPLANT
GOWN STRL REUS W/TWL LRG LVL3 (GOWN DISPOSABLE) ×2 IMPLANT
GUIDEWIRE STR DUAL SENSOR (WIRE) ×2 IMPLANT
GUIDEWIRE ZIPWRE .038 STRAIGHT (WIRE) IMPLANT
KIT TURNOVER KIT A (KITS) ×2 IMPLANT
MANIFOLD NEPTUNE II (INSTRUMENTS) ×2 IMPLANT
PACK CYSTO (CUSTOM PROCEDURE TRAY) ×2 IMPLANT
STENT URET 6FRX26 CONTOUR (STENTS) ×2 IMPLANT
SYR 10ML LL (SYRINGE) ×2 IMPLANT
TUBING CONNECTING 10 (TUBING) ×2 IMPLANT
TUBING UROLOGY SET (TUBING) IMPLANT

## 2021-02-19 NOTE — Discharge Instructions (Signed)
You underwent bilateral ureteral stent placement with Dr. Abner Greenspan.  You will be contacted to arrange your next procedure for bilateral ureteroscopic stone extraction. It is very important that you follow up for this proecure as the stents are not permanent and can cause major issues if not removed and stones treated.   You may see some blood in the urine and may have some burning with urination for 48-72 hours. You also may notice that you have to urinate more frequently or urgently after your procedure which is normal.  You should call should you develop an inability urinate, fever > 101, persistent nausea and vomiting that prevents you from eating or drinking to stay hydrated.  If you have a stent, you will likely urinate more frequently and urgently until the stent is removed and you may experience some discomfort/pain in the lower abdomen and flank especially when urinating. You may take pain medication prescribed to you if needed for pain. You may also intermittently have blood in the urine until the stent is removed. If you have a catheter, you will be taught how to take care of the catheter by the nursing staff prior to discharge from the hospital.  You may periodically feel a strong urge to void with the catheter in place.  This is a bladder spasm and most often can occur when having a bowel movement or moving around. It is typically self-limited and usually will stop after a few minutes.  You may use some Vaseline or Neosporin around the tip of the catheter to reduce friction at the tip of the penis. You may also see some blood in the urine.  A very small amount of blood can make the urine look quite red.  As long as the catheter is draining well, there usually is not a problem.  However, if the catheter is not draining well and is bloody, you should call the office [phone]) to notify us.

## 2021-02-19 NOTE — Anesthesia Procedure Notes (Signed)
Procedure Name: LMA Insertion Date/Time: 02/19/2021 5:55 PM Performed by: Rosaland Lao, CRNA Pre-anesthesia Checklist: Patient identified, Emergency Drugs available, Suction available and Patient being monitored Patient Re-evaluated:Patient Re-evaluated prior to induction Oxygen Delivery Method: Circle system utilized Preoxygenation: Pre-oxygenation with 100% oxygen Induction Type: IV induction LMA: LMA with gastric port inserted LMA Size: 4.0 Number of attempts: 1 Placement Confirmation: positive ETCO2 and breath sounds checked- equal and bilateral Tube secured with: Tape Dental Injury: Teeth and Oropharynx as per pre-operative assessment
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
## 2021-02-19 NOTE — Anesthesia Preprocedure Evaluation (Addendum)
Anesthesia Evaluation  Patient identified by MRN, date of birth, ID band Patient awake    Reviewed: Allergy & Precautions, NPO status , Patient's Chart, lab work & pertinent test results  Airway Mallampati: II  TM Distance: >3 FB Neck ROM: Full    Dental no notable dental hx.    Pulmonary sleep apnea ,    Pulmonary exam normal breath sounds clear to auscultation       Cardiovascular hypertension, Pt. on medications Normal cardiovascular exam Rhythm:Regular Rate:Normal  Echo 2011: Left ventricle: The cavity size was normal. There was mild focal  basal hypertrophy of the septum. Systolic function was normal. The  estimated ejection fraction was in the range of 55% to 60%. Wall  motion was normal; there were no regional wall motion abnormalities.  Doppler parameters are consistent with abnormal left ventricular  relaxation (grade 1 diastolic dysfunction).    Neuro/Psych negative neurological ROS  negative psych ROS   GI/Hepatic Neg liver ROS, GERD  Medicated and Controlled,  Endo/Other  negative endocrine ROS  Renal/GU Renal disease (B/L ureteral stones)     Musculoskeletal negative musculoskeletal ROS (+)   Abdominal   Peds  Hematology negative hematology ROS (+)   Anesthesia Other Findings   Reproductive/Obstetrics negative OB ROS                            Anesthesia Physical Anesthesia Plan  ASA: 2  Anesthesia Plan: General   Post-op Pain Management:    Induction: Intravenous  PONV Risk Score and Plan: 3 and Ondansetron, Dexamethasone, Midazolam and Treatment may vary due to age or medical condition  Airway Management Planned: LMA  Additional Equipment: None  Intra-op Plan:   Post-operative Plan: Extubation in OR  Informed Consent: I have reviewed the patients History and Physical, chart, labs and discussed the procedure including the risks, benefits and  alternatives for the proposed anesthesia with the patient or authorized representative who has indicated his/her understanding and acceptance.     Dental advisory given  Plan Discussed with: CRNA  Anesthesia Plan Comments:        Anesthesia Quick Evaluation

## 2021-02-19 NOTE — Telephone Encounter (Signed)
Patient  is requesting for a referral to a urology due  to kidney stones concerns and blood in urine. Requested [facility]

## 2021-02-19 NOTE — Telephone Encounter (Signed)
Referral placed.

## 2021-02-19 NOTE — Transfer of Care (Signed)
Immediate Anesthesia Transfer of Care Note  Patient: Danny Henderson  Procedure(s) Performed: CYSTOSCOPY WITH RETROGRADE PYELOGRAM/URETERAL STENT PLACEMENT (Bilateral)  Patient Location: PACU  Anesthesia Type:General  Level of Consciousness: awake, alert  and oriented  Airway & Oxygen Therapy: Patient Spontanous Breathing and Patient connected to face mask  Post-op Assessment: Report given to RN and Post -op Vital signs reviewed and stable  Post vital signs: Reviewed and stable  Last Vitals:  Vitals Value Taken Time  BP 138/87 02/19/21 1826  Temp 36.5 C 02/19/21 1826  Pulse 76 02/19/21 1831  Resp 19 02/19/21 1831  SpO2 98 % 02/19/21 1831  Vitals shown include unvalidated device data.  Last Pain:  Vitals:   02/19/21 1641  TempSrc:   PainSc: 3       Patients Stated Pain Goal: 0 (22/97/98 9211)  Complications: No notable events documented.

## 2021-02-19 NOTE — Progress Notes (Signed)
Placed wrong time; Time arrival to PACU 1826

## 2021-02-19 NOTE — Anesthesia Postprocedure Evaluation (Signed)
Anesthesia Post Note  Patient: Danny Henderson  Procedure(s) Performed: CYSTOSCOPY WITH RETROGRADE PYELOGRAM/URETERAL STENT PLACEMENT (Bilateral)     Patient location during evaluation: PACU Anesthesia Type: General Level of consciousness: awake and alert, oriented and patient cooperative Pain management: pain level controlled Vital Signs Assessment: post-procedure vital signs reviewed and stable Respiratory status: spontaneous breathing, nonlabored ventilation and respiratory function stable Cardiovascular status: blood pressure returned to baseline and stable Postop Assessment: no apparent nausea or vomiting Anesthetic complications: no   No notable events documented.  Last Vitals:  Vitals:   02/19/21 1845 02/19/21 1923  BP: (!) 161/96 (!) 165/90  Pulse: 74 69  Resp: 20 15  Temp:  36.6 C  SpO2: 100%     Last Pain:  Vitals:   02/19/21 1830  TempSrc:   PainSc: [place]

## 2021-02-19 NOTE — Op Note (Addendum)
PATIENT:  Danny Henderson  Preoperative diagnosis:  Bilateral obstructin ureteral stones   Postoperative diagnosis:  Bilateral obstructing ureteral stones  Procedure:  Cystoscopy Bilateral ureteral stent placement  Bilateral retrograde pyelography with interpretation   Surgeon: Rexene Alberts, M.D. Assistant:Yousef Abu-Salha, MD  Anesthesia: General  Complications: None  EBL: Minimal  Specimens: None  Indication: Danny Henderson is a 55 y.o. male with bilateral ureteral stones and hydronephrosis.  Urinalysis is negative for leukocytes and nitrites.  Creatinine is reassuring 1.. After reviewing the management options for treatment, they have elected to proceed with the above surgical procedure(s). We have discussed the potential benefits and risks of the procedure, side effects of the proposed treatment, the likelihood of the patient achieving the goals of the procedure, and any potential problems that might occur during the procedure or recuperation. Informed consent has been obtained.  Findings: Bilateral RPG revealed moderate hydronephrosis bilaterally. Hydronephrotic drip on the left side. Sent for culture. Successful bilateral ureteral stent placement (43fr x 26cm) without string.   Description of procedure:   The patient was taken to the operating room and general anesthesia was induced.  The patient was placed in the dorsal lithotomy position, prepped and draped in the usual sterile fashion, and preoperative antibiotics were administered. A preoperative time-out was performed.   Cystourethroscopy was performed.  The patient's urethra was examined and was normal. The bladder was then systematically examined in its entirety. There was no evidence for any bladder tumors, stones, or other mucosal pathology.    Attention then turned to the left ureteral orifice and a ureteral catheter was used to intubate the ureteral orifice.  Advanced to level of renal pelvis over the wire. Proximal  urine sample was collected (hydronephrotic drip) and sent for urine culture. Then, omnipaque contrast was injected through the ureteral catheter and a retrograde pyelogram which revealed the above findings.  A Sensor guidewire was then advanced up the left ureter into the renal pelvis under fluoroscopic guidance. A ureteral stent was advanced over the wire using Seldinger technique.  The stent was positioned appropriately under fluoroscopic and cystoscopic guidance.  The wire was then removed with an adequate stent curl noted in the renal pelvis as well as in the bladder.  Attention was then turned to the right ureteral orifice and a ureteral catheter was used to intubate the ureteral orifice.  Advanced to level of renal pelvis over the wire. No hydronephrotic drip so no proximal urine culture was sent on the right side. Then, omnipaque contrast was injected through the ureteral catheter and a retrograde pyelogram which revealed the above findings.  A Sensor guidewire was then advanced up the right ureter into the renal pelvis under fluoroscopic guidance. A ureteral stent was advanced over the wire using Seldinger technique.  The stent was positioned appropriately under fluoroscopic and cystoscopic guidance.  The wire was then removed with an adequate stent curl noted in the renal pelvis as well as in the bladder.  The bladder was then emptied and the procedure ended.  The patient appeared to tolerate the procedure well and without complications.  The patient was able to be awakened and transferred to the recovery unit in satisfactory condition.    Plan: - posted for bilateral USE - prn tramadol scheduled tylenol and flomax - Will follow up urine culture and prescribe antibiotics if indicated  [facility]  Pager: [phone]

## 2021-02-20 ENCOUNTER — Encounter (HOSPITAL_COMMUNITY): Payer: Self-pay | Admitting: Urology

## 2021-02-23 ENCOUNTER — Other Ambulatory Visit: Payer: Self-pay | Admitting: Urology

## 2021-02-24 LAB — URINE CULTURE: Culture: NO GROWTH

## 2021-03-02 NOTE — Progress Notes (Addendum)
COVID Vaccine Completed: Yes x2 Date COVID Vaccine completed: 04-23-20, 05-27-20 Has received booster: No COVID vaccine manufacturer: [employer]     Date of COVID positive in last 90 days: No  PCP - Kathlene November, MD Cardiologist - Darlina Guys, MD Last OV 2011  Chest x-ray - N/A EKG - 02-19-21 Epic Stress Test - 2002 Epic ECHO - N/A Cardiac Cath - 2002 Pacemaker/ICD device last checked: Spinal Cord Stimulator:  Sleep Study -  2017 Epic, +sleep apnea CPAP - Yes  Fasting Blood Sugar - N/A Checks Blood Sugar _____ times a day  Blood Thinner Instructions:  N/A Aspirin Instructions: Last Dose:  Activity level:  Can go up a flight of stairs and perform activities of daily living without stopping and without symptoms of chest pain or shortness of breath.  Able to exercise without symptoms     Anesthesia review:  Hx of chest pain, RBBB, OSA on CPAP, HTN.   Patient states that he has not had recurrent chest pain in years and denies SOB.  Patient denies shortness of breath, fever, cough and chest pain at PAT appointment   Patient verbalized understanding of instructions that were given to them at the PAT appointment. Patient was also instructed that they will need to review over the PAT instructions again at home before surgery.

## 2021-03-02 NOTE — Patient Instructions (Addendum)
DUE TO COVID-19 ONLY ONE VISITOR IS ALLOWED TO COME WITH YOU AND STAY IN THE WAITING ROOM ONLY DURING PRE OP AND PROCEDURE.   **NO VISITORS ARE ALLOWED IN THE SHORT STAY AREA OR RECOVERY ROOM!!**         Your procedure is scheduled on:  Friday, 03-06-21   Report to [facility] Main  Entrance   Report to admitting at 3:15 PM   Call this number if you have problems the morning of surgery [phone]   Do not eat food :After Midnight.   May have liquids until 2:15 PM  day of surgery  CLEAR LIQUID DIET  Foods Allowed                                                                     Foods Excluded  Water, Black Coffee and tea, regular and decaf              liquids that you cannot  Plain Jell-O in any flavor  (No red)                                    see through such as: Fruit ices (not with fruit pulp)                                      milk, soups, orange juice              Iced Popsicles (No red)                                      All solid food                                   Apple juices Sports drinks like Gatorade (No red) Lightly seasoned clear broth or consume(fat free) Sugar, honey syrup      Oral Hygiene is also important to reduce your risk of infection.                                    Remember - BRUSH YOUR TEETH THE MORNING OF SURGERY WITH YOUR REGULAR TOOTHPASTE   Do NOT smoke after Midnight   Take these medicines the morning of surgery with A SIP OF WATER:  Pantoprazole, Tamsulosin, Tylenol                             You may not have any metal on your body including jewelry, and body piercing             Do not wear lotions, powders, cologne, or deodorant              Men may shave face and neck.   Do not bring valuables to the hospital. [facility]  VALUABLES.   Contacts, dentures or bridgework may not be worn into surgery.   Bring CPAP mask and tubing day of surgery.   Patients discharged the day of surgery  will not be allowed to drive home.   Please read over the following fact sheets you were given: IF YOU HAVE QUESTIONS ABOUT YOUR PRE OP INSTRUCTIONS PLEASE CALL [facility] - Preparing for Surgery Before surgery, you can play an important role.  Because skin is not sterile, your skin needs to be as free of germs as possible.  You can reduce the number of germs on your skin by washing with CHG (chlorahexidine gluconate) soap before surgery.  CHG is an antiseptic cleaner which kills germs and bonds with the skin to continue killing germs even after washing. Please DO NOT use if you have an allergy to CHG or antibacterial soaps.  If your skin becomes reddened/irritated stop using the CHG and inform your nurse when you arrive at Short Stay. Do not shave (including legs and underarms) for at least 48 hours prior to the first CHG shower.  You may shave your face/neck.  Please follow these instructions carefully:  1.  Shower with CHG Soap the night before surgery and the  morning of surgery.  2.  If you choose to wash your hair, wash your hair first as usual with your normal  shampoo.  3.  After you shampoo, rinse your hair and body thoroughly to remove the shampoo.                             4.  Use CHG as you would any other liquid soap.  You can apply chg directly to the skin and wash.  Gently with a scrungie or clean washcloth.  5.  Apply the CHG Soap to your body ONLY FROM THE NECK DOWN.   Do   not use on face/ open                           Wound or open sores. Avoid contact with eyes, ears mouth and   genitals (private parts).                       Wash face,  Genitals (private parts) with your normal soap.             6.  Wash thoroughly, paying special attention to the area where your    surgery  will be performed.  7.  Thoroughly rinse your body with warm water from the neck down.  8.  DO NOT shower/wash with your normal soap after using and rinsing off the CHG Soap.                 9.  Pat yourself dry with a clean towel.            10.  Wear clean pajamas.            11.  Place clean sheets on your bed the night of your first shower and do not  sleep with pets. Day of Surgery : Do not apply any lotions/deodorants the morning of surgery.  Please wear clean clothes to the hospital/surgery center.  FAILURE TO FOLLOW THESE INSTRUCTIONS MAY RESULT IN THE CANCELLATION OF YOUR SURGERY  PATIENT SIGNATURE_________________________________  NURSE SIGNATURE__________________________________  ________________________________________________________________________

## 2021-03-03 ENCOUNTER — Encounter (HOSPITAL_COMMUNITY)
Admission: RE | Admit: 2021-03-03 | Discharge: 2021-03-03 | Disposition: A | Discharge status: Home / Self Care | Payer: 59 | Source: Ambulatory Visit | Attending: Urology | Admitting: Urology

## 2021-03-03 ENCOUNTER — Other Ambulatory Visit: Payer: Self-pay

## 2021-03-03 ENCOUNTER — Encounter (HOSPITAL_COMMUNITY): Payer: Self-pay

## 2021-03-03 DIAGNOSIS — Z01812 Encounter for preprocedural laboratory examination: Secondary | ICD-10-CM | POA: Insufficient documentation

## 2021-03-05 NOTE — Discharge Instructions (Addendum)
[facility] [phone] Post Ureteroscopy With or Without Stent Instructions  Definitions:  Ureter: The duct that transports urine from the kidney to the bladder. Stent:   A plastic hollow tube that is placed into the ureter, from the kidney to the bladder to prevent the ureter from swelling shut.  GENERAL INSTRUCTIONS:  Despite the fact that no skin incisions were used, the area around the ureter and bladder is raw and irritated. The stent is a foreign body which will further irritate the bladder wall. This irritation is manifested by increased frequency of urination, both day and night, and by an increase in the urge to urinate. In some, the urge to urinate is present almost always. Sometimes the urge is strong enough that you may not be able to stop yourself from urinating. The only real cure is to remove the stent and then give time for the bladder wall to heal which can't be done until the danger of the ureter swelling shut has passed, which varies.  You may see some blood in your urine while the stent is in place and a few days afterwards. Do not be alarmed, even if the urine was clear for a while. Get off your feet and drink lots of fluids until clearing occurs. If you start to pass clots or don't improve, call us.  DIET: You may return to your normal diet immediately. Because of the raw surface of your bladder, alcohol, spicy foods, acid type foods and drinks with caffeine may cause irritation or frequency and should be used in moderation. To keep your urine flowing freely and to avoid constipation, drink plenty of fluids during the day ( 8-10 glasses ). Tip: Avoid cranberry juice because it is very acidic.  ACTIVITY: Your physical activity doesn't need to be restricted. However, if you are very active, you may see some blood in your urine. We suggest that you reduce your activity under these circumstances until the bleeding has stopped.  BOWELS: It is important to  keep your bowels regular during the postoperative period. Straining with bowel movements can cause bleeding. A bowel movement every other day is reasonable. Use a mild laxative if needed, such as Milk of Magnesia 2-3 tablespoons, or 2 Dulcolax tablets. Call if you continue to have problems. If you have been taking narcotics for pain, before, during or after your surgery, you may be constipated. Take a laxative if necessary.   MEDICATION: You should resume your pre-surgery medications unless told not to. In addition you will often be given an antibiotic to prevent infection. These should be taken as prescribed until the bottles are finished unless you are having an unusual reaction to one of the drugs.  PROBLEMS YOU SHOULD REPORT TO [place]: Fevers over 100.5 Fahrenheit. Heavy bleeding, or clots ( See above notes about blood in urine ). Inability to urinate. Drug reactions ( hives, rash, nausea, vomiting, diarrhea ). Severe burning or pain with urination that is not improving.  FOLLOW-UP: You will need a follow-up appointment to monitor your progress. Call for this appointment at the number listed above. Usually the first appointment will be about three to fourteen days after your surgery.  PATIENT INSTRUCTIONS POST-ANESTHESIA  IMMEDIATELY FOLLOWING SURGERY:  Do not drive or operate machinery for the first twenty four hours after surgery.  Do not make any important decisions for twenty four hours after surgery or while taking narcotic pain medications or sedatives.  If you develop intractable nausea and vomiting or a severe headache please  notify your doctor immediately.  FOLLOW-UP:  Please make an appointment with your surgeon as instructed. You do not need to follow up with anesthesia unless specifically instructed to do so.  WOUND CARE INSTRUCTIONS (if applicable):  Keep a dry clean dressing on the anesthesia/puncture wound site if there is drainage.  Once the wound has quit draining you may  leave it open to air.  Generally you should leave the bandage intact for twenty four hours unless there is drainage.  If the epidural site drains for more than 36-48 hours please call the anesthesia department.  QUESTIONS?:  Please feel free to call your physician or the hospital operator if you have any questions, and they will be happy to assist you.

## 2021-03-05 NOTE — Anesthesia Preprocedure Evaluation (Addendum)
Anesthesia Evaluation  Patient identified by MRN, date of birth, ID band Patient awake    Reviewed: Allergy & Precautions, NPO status , Patient's Chart, lab work & pertinent test results  Airway Mallampati: II  TM Distance: >3 FB Neck ROM: Full    Dental no notable dental hx. (+) Teeth Intact, Dental Advisory Given   Pulmonary sleep apnea and Continuous Positive Airway Pressure Ventilation ,    Pulmonary exam normal breath sounds clear to auscultation       Cardiovascular hypertension, Normal cardiovascular exam Rhythm:Regular Rate:Normal     Neuro/Psych    GI/Hepatic GERD  ,  Endo/Other    Renal/GU Renal diseaseNephrolitiasis Lab Results      Component                Value               Date                      CREATININE               1.06                02/19/2021                BUN                      14                  02/19/2021                NA                       140                 02/19/2021                K                        3.6                 02/19/2021                CL                       108                 02/19/2021                CO2                      23                  02/19/2021           l      Musculoskeletal   Abdominal   Peds  Hematology Lab Results      Component                Value               Date                      WBC                      8.6  02/19/2021                HGB                      14.8                02/19/2021                HCT                      44.2                02/19/2021                MCV                      89.5                02/19/2021                PLT                      208                 02/19/2021              Anesthesia Other Findings   Reproductive/Obstetrics                            Anesthesia Physical Anesthesia Plan  ASA: 2  Anesthesia Plan: General   Post-op Pain  Management:    Induction: Intravenous  PONV Risk Score and Plan: 3 and Treatment may vary due to age or medical condition, Midazolam, Dexamethasone and Ondansetron  Airway Management Planned: LMA  Additional Equipment: None  Intra-op Plan:   Post-operative Plan:   Informed Consent: I have reviewed the patients History and Physical, chart, labs and discussed the procedure including the risks, benefits and alternatives for the proposed anesthesia with the patient or authorized representative who has indicated his/her understanding and acceptance.     Dental advisory given  Plan Discussed with: CRNA and Anesthesiologist  Anesthesia Plan Comments: (LMA GA)       Anesthesia Quick Evaluation
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
## 2021-03-06 ENCOUNTER — Encounter (HOSPITAL_COMMUNITY)
Admission: RE | Disposition: A | Discharge status: Home / Self Care | Payer: Self-pay | Source: Home / Self Care | Attending: Urology

## 2021-03-06 ENCOUNTER — Encounter (HOSPITAL_COMMUNITY): Payer: Self-pay | Admitting: Urology

## 2021-03-06 ENCOUNTER — Ambulatory Visit (HOSPITAL_COMMUNITY)
Admission: RE | Admit: 2021-03-06 | Discharge: 2021-03-06 | Disposition: A | Discharge status: Home / Self Care | Payer: 59 | Attending: Urology | Admitting: Urology

## 2021-03-06 ENCOUNTER — Ambulatory Visit (HOSPITAL_COMMUNITY): Payer: 59 | Admitting: Anesthesiology

## 2021-03-06 ENCOUNTER — Ambulatory Visit (HOSPITAL_COMMUNITY): Payer: 59 | Admitting: Physician Assistant

## 2021-03-06 ENCOUNTER — Ambulatory Visit (HOSPITAL_COMMUNITY): Payer: 59

## 2021-03-06 ENCOUNTER — Other Ambulatory Visit: Payer: Self-pay

## 2021-03-06 DIAGNOSIS — N201 Calculus of ureter: Secondary | ICD-10-CM

## 2021-03-06 DIAGNOSIS — Z87442 Personal history of urinary calculi: Secondary | ICD-10-CM | POA: Insufficient documentation

## 2021-03-06 DIAGNOSIS — N132 Hydronephrosis with renal and ureteral calculous obstruction: Secondary | ICD-10-CM | POA: Insufficient documentation

## 2021-03-06 HISTORY — PX: CYSTOSCOPY/URETEROSCOPY/HOLMIUM LASER/STENT PLACEMENT: SHX6546

## 2021-03-06 SURGERY — CYSTOSCOPY/URETEROSCOPY/HOLMIUM LASER/STENT PLACEMENT
Anesthesia: General | Site: Ureter | Laterality: Bilateral

## 2021-03-06 MED ORDER — DEXAMETHASONE SODIUM PHOSPHATE 10 MG/ML IJ SOLN
INTRAMUSCULAR | Status: AC
  Filled 2021-03-06: qty 1

## 2021-03-06 MED ORDER — ORAL CARE MOUTH RINSE: 15.0000 mL | Freq: Once | OROMUCOSAL | Status: DC

## 2021-03-06 MED ORDER — PROPOFOL 10 MG/ML IV BOLUS
INTRAVENOUS | Status: AC
  Filled 2021-03-06: qty 20

## 2021-03-06 MED ORDER — PROPOFOL 10 MG/ML IV BOLUS
INTRAVENOUS | Status: DC | PRN
Start: 2021-03-06 — End: 2021-03-06
  Administered 2021-03-06: 200 mg via INTRAVENOUS

## 2021-03-06 MED ORDER — LIDOCAINE 2% (20 MG/ML) 5 ML SYRINGE
INTRAMUSCULAR | Status: AC
  Filled 2021-03-06: qty 5

## 2021-03-06 MED ORDER — FENTANYL CITRATE (PF) 100 MCG/2ML IJ SOLN
INTRAMUSCULAR | Status: AC
  Filled 2021-03-06: qty 2

## 2021-03-06 MED ORDER — IOHEXOL 300 MG/ML  SOLN
INTRAMUSCULAR | Status: DC | PRN
  Administered 2021-03-06: 10 mL

## 2021-03-06 MED ORDER — ONDANSETRON HCL 4 MG/2ML IJ SOLN
INTRAMUSCULAR | Status: DC | PRN
  Administered 2021-03-06: 4 mg via INTRAVENOUS

## 2021-03-06 MED ORDER — HYDROMORPHONE HCL 1 MG/ML IJ SOLN: 0.2500 mg | INTRAMUSCULAR | Status: DC | PRN

## 2021-03-06 MED ORDER — ONDANSETRON HCL 4 MG/2ML IJ SOLN
INTRAMUSCULAR | Status: AC
  Filled 2021-03-06: qty 2

## 2021-03-06 MED ORDER — LACTATED RINGERS IV SOLN: INTRAVENOUS | Status: DC

## 2021-03-06 MED ORDER — CEPHALEXIN 500 MG PO CAPS: 500.0000 mg | ORAL_CAPSULE | Freq: Two times a day (BID) | ORAL | 0 refills | Status: AC

## 2021-03-06 MED ORDER — LIDOCAINE 2% (20 MG/ML) 5 ML SYRINGE
INTRAMUSCULAR | Status: DC | PRN
  Administered 2021-03-06: 100 mg via INTRAVENOUS

## 2021-03-06 MED ORDER — DOCUSATE SODIUM 100 MG PO CAPS: 100.0000 mg | ORAL_CAPSULE | Freq: Every day | ORAL | 0 refills | Status: DC | PRN

## 2021-03-06 MED ORDER — OXYCODONE HCL 5 MG/5ML PO SOLN: 5.0000 mg | Freq: Once | ORAL | Status: DC | PRN

## 2021-03-06 MED ORDER — MIDAZOLAM HCL 5 MG/5ML IJ SOLN
INTRAMUSCULAR | Status: DC | PRN
  Administered 2021-03-06: 2 mg via INTRAVENOUS

## 2021-03-06 MED ORDER — SODIUM CHLORIDE 0.9 % IR SOLN
Status: DC | PRN
  Administered 2021-03-06: 6000 mL via INTRAVESICAL

## 2021-03-06 MED ORDER — MIDAZOLAM HCL 2 MG/2ML IJ SOLN
INTRAMUSCULAR | Status: AC
  Filled 2021-03-06: qty 2

## 2021-03-06 MED ORDER — CHLORHEXIDINE GLUCONATE 0.12 % MT SOLN: 15.0000 mL | Freq: Once | OROMUCOSAL | Status: DC

## 2021-03-06 MED ORDER — AMISULPRIDE (ANTIEMETIC) 5 MG/2ML IV SOLN: 10.0000 mg | Freq: Once | INTRAVENOUS | Status: DC | PRN

## 2021-03-06 MED ORDER — OXYCODONE-ACETAMINOPHEN 5-325 MG PO TABS: 1.0000 | ORAL_TABLET | ORAL | 0 refills | Status: DC | PRN

## 2021-03-06 MED ORDER — SODIUM CHLORIDE 0.9 % IV SOLN
2.0000 g | Freq: Once | INTRAVENOUS | Status: AC
  Administered 2021-03-06: 2 g via INTRAVENOUS
  Filled 2021-03-06: qty 2

## 2021-03-06 MED ORDER — DEXAMETHASONE SODIUM PHOSPHATE 10 MG/ML IJ SOLN
INTRAMUSCULAR | Status: DC | PRN
  Administered 2021-03-06: 8 mg via INTRAVENOUS

## 2021-03-06 MED ORDER — FENTANYL CITRATE (PF) 250 MCG/5ML IJ SOLN
INTRAMUSCULAR | Status: DC | PRN
  Administered 2021-03-06: 100 ug via INTRAVENOUS
  Administered 2021-03-06: 25 ug via INTRAVENOUS

## 2021-03-06 MED ORDER — CEPHALEXIN 500 MG PO CAPS: 500.0000 mg | ORAL_CAPSULE | Freq: Two times a day (BID) | ORAL | 0 refills | Status: DC

## 2021-03-06 MED ORDER — ONDANSETRON HCL 4 MG/2ML IJ SOLN: 4.0000 mg | Freq: Once | INTRAMUSCULAR | Status: DC | PRN

## 2021-03-06 MED ORDER — ACETAMINOPHEN 10 MG/ML IV SOLN: 1000.0000 mg | Freq: Once | INTRAVENOUS | Status: DC | PRN

## 2021-03-06 MED ORDER — OXYCODONE HCL 5 MG PO TABS: 5.0000 mg | ORAL_TABLET | Freq: Once | ORAL | Status: DC | PRN

## 2021-03-06 SURGICAL SUPPLY — 26 items
BASKET ZERO TIP NITINOL 2.4FR (BASKET) ×1 IMPLANT
BSKT STON RTRVL ZERO TP 2.4FR (BASKET) ×1
CATH SET URETHRAL DILATOR (CATHETERS) IMPLANT
CATH URET 5FR 28IN OPEN ENDED (CATHETERS) ×2 IMPLANT
CLOTH BEACON ORANGE TIMEOUT ST (SAFETY) ×2 IMPLANT
FIBER LASER MOSES 200 DFL (Laser) ×1 IMPLANT
GLOVE SURG ENC MOIS LTX SZ7 (GLOVE) ×2 IMPLANT
GOWN STRL REUS W/TWL LRG LVL3 (GOWN DISPOSABLE) ×2 IMPLANT
GUIDEWIRE STR DUAL SENSOR (WIRE) ×3 IMPLANT
GUIDEWIRE ZIPWRE .038 STRAIGHT (WIRE) IMPLANT
IV NS 1000ML (IV SOLUTION) ×2
IV NS 1000ML BAXH (IV SOLUTION) ×1 IMPLANT
IV NS IRRIG 3000ML ARTHROMATIC (IV SOLUTION) ×2 IMPLANT
KIT TURNOVER CYSTO (KITS) ×2 IMPLANT
LASER FIB FLEXIVA PULSE ID 365 (Laser) IMPLANT
MANIFOLD NEPTUNE II (INSTRUMENTS) ×2 IMPLANT
PACK CYSTO (CUSTOM PROCEDURE TRAY) ×2 IMPLANT
SET IRRIG Y TYPE TUR BLADDER L (SET/KITS/TRAYS/PACK) ×2 IMPLANT
STENT URET 6FRX26 CONTOUR (STENTS) ×2 IMPLANT
SYR 10ML LL (SYRINGE) ×2 IMPLANT
TRACTIP FLEXIVA PULS ID 200XHI (Laser) IMPLANT
TRACTIP FLEXIVA PULSE ID 200 (Laser)
TUBE FEEDING 8FR 16IN STR KANG (MISCELLANEOUS) IMPLANT
TUBING CONNECTING 10 (TUBING) ×2 IMPLANT
TUBING UROLOGY SET (TUBING) ×2 IMPLANT
WATER STERILE IRR 500ML POUR (IV SOLUTION) ×2 IMPLANT

## 2021-03-06 NOTE — Op Note (Signed)
Operative Note  Preoperative diagnosis:  1.  Bilateral ureteral stones  Postoperative diagnosis: 1.  Bilateral ureteral stones  Procedure(s): 1.  Cystoscopy 2. Bilateral ureteroscopy with laser lithotripsy and basket extraction of stones 3. Bilateral retrograde pyelogram 4. Bilateral ureteral stent exchange 5. Fluoroscopy with intraoperative interpretation  Surgeon: Rexene Alberts, MD  Assistants:  None  Anesthesia:  General  Complications:  None  EBL:  Minimal  Specimens: 1. None  Drains/Catheters: 1.  Bilateral 6Fr x 26cm ureteral stent WITHOUT a tether string  Intraoperative findings:   Cystoscopy demonstrated no suspicious bladder lesions Right ureteroscopy demonstrated a 82m proximal right ureteral stone succesfully basket extracted. Left ureteroscopy demonstrated 7 mm distal left ureteral stone.  This was fragmented and basket extracted. No significant hydronephrosis bilaterally Successful bilateral ureteral stent placement with tether strings bilaterally.  Indication:  Danny Henderson a 55y.o. male with CT A/P 02/19/2021 with bilateral ureteral stones.  He was found to have a 2 mm right proximal ureteral stone as well as a 7 mm left mid ureteral stone with hydronephrosis bilaterally.  He underwent bilateral ureteral stent placement on 02/19/2021.  He presents today for definitive treatment of his stones.  Description of procedure: After informed consent was obtained from the patient, the patient was identified and taken to the operating room and placed in the supine position.  General anesthesia was administered as well as perioperative IV antibiotics.  At the beginning of the case, a time-out was performed to properly identify the patient, the surgery to be performed, and the surgical site.  Sequential compression devices were applied to the lower extremities at the beginning of the case for DVT prophylaxis.  The patient was then placed in the dorsal lithotomy supine  position, prepped and draped in sterile fashion.  We then passed the 21-French rigid cystoscope through the urethra and into the bladder under vision without any difficulty, noting a normal urethra without strictures and a mildly obstructing prostate.  A systematic evaluation of the bladder revealed no evidence of any suspicious bladder lesions.  Ureteral orifices were in normal position.    The distal aspect of the ureteral stent was seen protruding from the right ureteral orifice.  We then used the alligator-tooth forceps and grasped the distal end of the ureteral stent and brought it out the urethral meatus while watching the proximal coil straighten out nicely on fluoroscopy. Through the ureteral stent, we then passed a 0.038 sensor wire up to the level of the renal pelvis.  The ureteral stent was then removed, leaving the sensor wire up the right ureter.    Under cystoscopic and flouroscopic guidance, we cannulated the right ureteral orifice with a 5-French open-ended ureteral catheter and a gentle retrograde pyelogram was performed, revealing a normal caliber ureter without any filling defects. There was no hydronephrosis of the collecting system. A 0.038 sensor wire was then passed up to the level of the renal pelvis and secured to the drape as a safety wire. The ureteral catheter and cystoscope were removed, leaving the safety wire in place.   The distal aspect of the left ureteral stent was seen protruding from the left ureteral orifice.  We then used the alligator-tooth forceps and grasped the distal end of the ureteral stent and brought it out the urethral meatus while watching the proximal coil straighten out nicely on fluoroscopy. Through the ureteral stent, we then passed a 0.038 sensor wire up to the level of the renal pelvis.  The ureteral stent was  then removed, leaving the sensor wire up the left ureter.    Under cystoscopic and flouroscopic guidance, we cannulated the left ureteral  orifice with a 5-French open-ended ureteral catheter and a gentle retrograde pyelogram was performed, revealing a normal caliber ureter without any filling defects. There was moderate left hydronephrosis of the collecting system. A 0.038 sensor wire was then passed up to the level of the renal pelvis and secured to the drape as a safety wire. The ureteral catheter and cystoscope were removed, leaving the safety wire in place.   A semi-rigid ureteroscope was passed alongside the wire up the distal right ureter which appeared normal. A second 0.038 sensor wire was passed under direct vision and the semirigid scope was removed.   A semi-rigid ureteroscope was passed alongside the wire up the distal left ureter which appeared normal. A second 0.038 sensor wire was passed under direct vision and the semirigid scope was removed.   I then navigated the flexible digital ureteroscope at the left ureter where I encountered a 7 mm stone.  This was dusted in its entirety.  I then removed all large fragments within the ureter.  I then performed left pyeloscopy.  There were no large stones identified.  There are some renal plaques identified in the left lower pole.  In similar fashion indicated the digital flexible ureteroscope with the right ureter wire encountered mostly old well-formed clot within the right ureter.  There was a small stone which I fragmented and removed into the bladder.  I then performed right pyeloscopy and noted no other stones within his right kidney.  Once the ureteroscope was removed, the Glidewire was backloaded through the rigid cystoscope, which was then advanced down the urethra and into the bladder. We then used the Glidewire under direct vision through the rigid cystoscope and under fluoroscopic guidance and passed up a 6-French, 26 cm double-pigtail ureteral stent up both the right and left ureter, making sure that the proximal and distal ends coiled within the kidney and bladder  respectively.  I did not leave a tether string.  There is some residual inflammation and well-formed clots.  I will bring him back to the office next week for ureteral stent removal bilaterally.  The patient tolerated the procedure well and there was no complication. Patient was awoken from anesthesia and taken to the recovery room in stable condition. I was present and scrubbed for the entirety of the case.  Plan:  Patient will be discharged home.  Follow up with me in 7 to 10 days for stent removal in the office.  Matt R. [facility]  Pager: [phone]

## 2021-03-06 NOTE — Anesthesia Procedure Notes (Signed)
Procedure Name: LMA Insertion Date/Time: 03/06/2021 6:24 PM Performed by: Cleda Daub, CRNA Pre-anesthesia Checklist: Patient identified, Emergency Drugs available, Suction available and Patient being monitored Patient Re-evaluated:Patient Re-evaluated prior to induction Oxygen Delivery Method: Circle system utilized Preoxygenation: Pre-oxygenation with 100% oxygen Induction Type: IV induction LMA: LMA inserted LMA Size: 5.0 Number of attempts: 1 Placement Confirmation: positive ETCO2 and breath sounds checked- equal and bilateral Tube secured with: Tape Dental Injury: Teeth and Oropharynx as per pre-operative assessment

## 2021-03-06 NOTE — Transfer of Care (Signed)
Immediate Anesthesia Transfer of Care Note  Patient: MATHUE MENKEN  Procedure(s) Performed: CYSTOSCOPY/URETEROSCOPY/HOLMIUM LASER/STENT PLACEMENT (Bilateral: Ureter)  Patient Location: PACU  Anesthesia Type:General  Level of Consciousness: drowsy  Airway & Oxygen Therapy: Patient Spontanous Breathing and Patient connected to face mask oxygen  Post-op Assessment: Report given to RN and Post -op Vital signs reviewed and stable  Post vital signs: Reviewed and stable  Last Vitals:  Vitals Value Taken Time  BP    Temp    Pulse 70 03/06/21 1938  Resp 12 03/06/21 1938  SpO2 100 % 03/06/21 1938  Vitals shown include unvalidated device data.  Last Pain:  Vitals:   03/06/21 1539  TempSrc:   PainSc: 5       Patients Stated Pain Goal: 4 ([id number])  Complications: No notable events documented.

## 2021-03-07 NOTE — Anesthesia Postprocedure Evaluation (Signed)
Anesthesia Post Note  Patient: Danny Henderson  Procedure(s) Performed: CYSTOSCOPY/URETEROSCOPY/HOLMIUM LASER/STENT PLACEMENT (Bilateral: Ureter)     Patient location during evaluation: PACU Anesthesia Type: General Level of consciousness: awake and alert Pain management: pain level controlled Vital Signs Assessment: post-procedure vital signs reviewed and stable Respiratory status: spontaneous breathing, nonlabored ventilation, respiratory function stable and patient connected to nasal cannula oxygen Cardiovascular status: blood pressure returned to baseline and stable Postop Assessment: no apparent nausea or vomiting Anesthetic complications: no   No notable events documented.  Last Vitals:  Vitals:   03/06/21 2035 03/06/21 2107  BP:  (!) 147/92  Pulse: 87 82  Resp:    Temp:  36.6 C  SpO2: 99% 99%    Last Pain:  Vitals:   03/06/21 2107  TempSrc:   PainSc: 0-No pain                 Barnet Glasgow

## 2021-03-08 ENCOUNTER — Encounter (HOSPITAL_COMMUNITY): Payer: Self-pay | Admitting: Urology

## 2021-04-14 DIAGNOSIS — Z87442 Personal history of urinary calculi: Secondary | ICD-10-CM | POA: Insufficient documentation

## 2021-08-04 ENCOUNTER — Encounter: Payer: Self-pay | Admitting: Internal Medicine

## 2021-08-04 ENCOUNTER — Ambulatory Visit (INDEPENDENT_AMBULATORY_CARE_PROVIDER_SITE_OTHER): Payer: 59 | Admitting: Internal Medicine

## 2021-08-04 VITALS — BP 136/82 | HR 84 | Temp 98.0°F | Resp 16 | Ht 72.0 in | Wt 216.2 lb

## 2021-08-04 DIAGNOSIS — Z0000 Encounter for general adult medical examination without abnormal findings: Secondary | ICD-10-CM | POA: Diagnosis not present

## 2021-08-04 DIAGNOSIS — R739 Hyperglycemia, unspecified: Secondary | ICD-10-CM | POA: Diagnosis not present

## 2021-08-04 DIAGNOSIS — I10 Essential (primary) hypertension: Secondary | ICD-10-CM | POA: Diagnosis not present

## 2021-08-04 DIAGNOSIS — Z23 Encounter for immunization: Secondary | ICD-10-CM

## 2021-08-04 DIAGNOSIS — L918 Other hypertrophic disorders of the skin: Secondary | ICD-10-CM

## 2021-08-04 DIAGNOSIS — Z1159 Encounter for screening for other viral diseases: Secondary | ICD-10-CM

## 2021-08-04 LAB — COMPREHENSIVE METABOLIC PANEL
ALT: 37 U/L (ref 0–53)
AST: 26 U/L (ref 0–37)
Albumin: 4.4 g/dL (ref 3.5–5.2)
Alkaline Phosphatase: 79 U/L (ref 39–117)
BUN: 10 mg/dL (ref 6–23)
CO2: 26 mEq/L (ref 19–32)
Calcium: 9.2 mg/dL (ref 8.4–10.5)
Chloride: 103 mEq/L (ref 96–112)
Creatinine, Ser: 1.07 mg/dL (ref 0.40–1.50)
GFR: 77.76 mL/min (ref >60.00)
Glucose, Bld: 101 mg/dL — ABNORMAL HIGH (ref 70–99)
Potassium: 4.4 mEq/L (ref 3.5–5.1)
Sodium: 137 mEq/L (ref 135–145)
Total Bilirubin: 0.4 mg/dL (ref 0.2–1.2)
Total Protein: 7.1 g/dL (ref 6.0–8.3)

## 2021-08-04 LAB — LIPID PANEL
Cholesterol: 192 mg/dL (ref 0–200)
HDL: 46.6 mg/dL (ref >39.00)
LDL Cholesterol: 117 mg/dL — ABNORMAL HIGH (ref 0–99)
NonHDL: 145.44
Total CHOL/HDL Ratio: 4
Triglycerides: 140 mg/dL (ref 0.0–149.0)
VLDL: 28 mg/dL (ref 0.0–40.0)

## 2021-08-04 LAB — HEMOGLOBIN A1C: Hgb A1c MFr Bld: 6.2 % (ref 4.6–6.5)

## 2021-08-04 NOTE — Patient Instructions (Signed)
Vaccines I recommend Shingrix COVID-vaccine  Check the  blood pressure regularly BP GOAL is between 110/65 and  135/85. If it is consistently higher or lower, let me know    GO TO THE LAB : Get the blood work     [place], [place] Come back for a physical exam in 1 year  The 10-year ASCVD risk score (Arnett DK, et al., 2019) is: 8.4%   Values used to calculate the score:     Age: 55 years     Sex: Male     Is Non-Hispanic African American: No     Diabetic: No     Tobacco smoker: No     Systolic Blood Pressure: 276 mmHg     Is BP treated: Yes     HDL Cholesterol: 47.9 mg/dL     Total Cholesterol: 205 mg/dL
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
## 2021-08-05 ENCOUNTER — Encounter: Payer: Self-pay | Admitting: Internal Medicine

## 2021-08-05 LAB — HEPATITIS C ANTIBODY
Hepatitis C Ab: NONREACTIVE
SIGNAL TO CUT-OFF: <0.02 (ref <1.00)

## 2021-08-05 NOTE — Assessment & Plan Note (Signed)
-  Td 08/04/2020 - shingrix: Patient will think about - Covid vax  booster  rec -Flu shot today -Colon cancer screening: Colonoscopy 07/2017, + polyp, per GI letter repeat in 10 years -prostate cancer screening: DRE- PSA wnl 2021 - (+) FH CAD: plan is to control CVRF - Diet exercise: Doing well -Labs: CMP, FLP, A1c, hep C - POA: Package of information provided

## 2021-08-05 NOTE — Assessment & Plan Note (Signed)
Here for CPX Hyperglycemia: Has a healthy lifestyle, check A1c HTN: Ambulatory BPs in the 130s/80s at home.  No change for now. OSA: Good CPAP compliance Kidney stones: Had a couple of procedures this year, currently asx RBBB: EKG done this year at baseline.  No symptoms. Tinnitus: At the end of the visit patient reports he has a long history of very mild L tinnitus with minimal if any HOH.  Symptoms are slightly worse lately?  We talk about possibly ENT referral but we agreed to wait for 1 year. Skin tags: Requested Derm referral. CV RF 8.4% at 10 years: Consider statins if risk increases RTC 1 year

## 2021-08-31 ENCOUNTER — Other Ambulatory Visit: Payer: Self-pay | Admitting: Internal Medicine

## 2021-09-08 ENCOUNTER — Ambulatory Visit: Payer: 59 | Admitting: Neurology

## 2021-10-06 ENCOUNTER — Telehealth: Payer: Self-pay | Admitting: Internal Medicine

## 2021-10-06 NOTE — Telephone Encounter (Signed)
Pt states he has tightness in his chest and short of breathe   Transferred to triage

## 2021-10-07 ENCOUNTER — Ambulatory Visit: Payer: 59 | Admitting: Neurology

## 2021-10-07 ENCOUNTER — Encounter: Payer: Self-pay | Admitting: Neurology

## 2021-10-07 VITALS — BP 130/86 | HR 93 | Ht 72.0 in | Wt 212.5 lb

## 2021-10-07 DIAGNOSIS — J301 Allergic rhinitis due to pollen: Secondary | ICD-10-CM | POA: Diagnosis not present

## 2021-10-07 DIAGNOSIS — G4733 Obstructive sleep apnea (adult) (pediatric): Secondary | ICD-10-CM | POA: Diagnosis not present

## 2021-10-07 DIAGNOSIS — I4510 Unspecified right bundle-branch block: Secondary | ICD-10-CM

## 2021-10-07 DIAGNOSIS — Z9989 Dependence on other enabling machines and devices: Secondary | ICD-10-CM

## 2021-10-07 NOTE — Telephone Encounter (Signed)
Noted, thanks!

## 2021-10-07 NOTE — Telephone Encounter (Signed)
Nurse Assessment ?Nurse: Marzetta Board RN, Kaitlyn Date/Time Eilene Ghazi Time): 10/06/2021 4:56:21 PM ?Confirm and document reason for call. If ?symptomatic, describe symptoms. ?---The caller states that for the last 2-3 weeks he has ?intermittent tightness in chest, shortness of breath, ?and headaches. These symptoms are not constant. The ?caller is currently stating he cannot take a deep breath ?and a slight headache. The caller feels mildly SOB at ?rest. ?Does the patient have any new or worsening ?symptoms? ---Yes ?Will a triage be completed? ---Yes ?Related visit to physician within the last 2 weeks? ---No ?Does the PT have any chronic conditions? (i.e. ?diabetes, asthma, this includes High risk factors for ?pregnancy, etc.) ?---Yes ?List chronic conditions. ---HTN, GERD ?Is this a behavioral health or substance abuse call? ---No ?Guidelines ?Guideline Title Affirmed Question Affirmed Notes Nurse Date/Time (Eastern ?Time) ?Breathing Difficulty [1] MODERATE ?difficulty breathing ?(e.g., speaks in ?phrases, SOB even at ?rest, pulse 100-120) ?AND [2] NEW-onset ?Marzetta Board, RN, [place] 10/06/2021 4:59:02 ?PM ?PLEASE NOTE: All timestamps contained within this report are represented as [place] Standard Time. ?CONFIDENTIALTY NOTICE: This fax transmission is intended only for the addressee. It contains information that is legally privileged, confidential or ?otherwise protected from use or disclosure. If you are not the intended recipient, you are strictly prohibited from reviewing, disclosing, copying using ?or disseminating any of this information or taking any action in reliance on or regarding this information. If you have received this fax in error, please ?notify us immediately by telephone so that we can arrange for its return to [place]. Phone: [phone], Toll-Free: [phone], Fax: [phone] ?Page: 2 of 2 ?Call Id: [id number] ?Guidelines ?Guideline Title Affirmed Question Affirmed Notes Nurse Date/Time (Eastern ?Time) ?or  WORSE than ?normal ?Disp. Time (Eastern ?Time) Disposition Final User ?10/06/2021 4:53:20 PM Send to Urgent Shela Nevin, [place] ?10/06/2021 5:03:46 PM Go to ED Now Yes Marzetta Board, RN, Verline Lema ?Caller Disagree/Comply Comply ?Caller Understands Yes ?PreDisposition InappropriateToAsk ?Care Advice Given Per Guideline ?GO TO ED NOW: * Go to the ED at ___________ Hospital. * You need to be seen in the Emergency Department. * Leave now. ?Drive carefully. BRING MEDICINES: * Bring a list of your current medicines when you go to the Emergency Department (ER). ?CALL EMS 911 IF: * Call EMS if you become worse. CARE ADVICE given per Breathing Difficulty (Adult) guideline. NOTE TO ?TRIAGER - DRIVING: * Another adult should drive. ?Comments ?User: Rudene Christians, RN Date/Time Eilene Ghazi Time): 10/06/2021 5:04:41 PM ?This is not a new issue to the caller, but he has not been evaluated for these symptoms and he states it has become ?more frequent where he is having these symptoms throughout the entire day now. ?Referrals ?[facility] - ED ?

## 2021-10-07 NOTE — Telephone Encounter (Signed)
Called Pt this morning to check on him- he did not go to ED because he states this problem has been ongoing for a "while, and typically improves with rest" and d/t long ED wait times. 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 We agreed to an appt tomorrow, 3/2 with Lovena Le, NP at 8:20am- he is unable to come this morning d/t work and has an appt with neurology later this afternoon for routine follow-up on OSA. Informed if pain, SHOB comes back and does not improve not to wait and proceed to ED. Pt verbalized understanding.  ?
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
## 2021-10-08 ENCOUNTER — Encounter: Payer: Self-pay | Admitting: Family Medicine

## 2021-10-08 ENCOUNTER — Ambulatory Visit: Payer: 59 | Admitting: Family Medicine

## 2021-10-08 VITALS — BP 114/78 | HR 81 | Resp 20 | Ht 72.0 in | Wt 214.0 lb

## 2021-10-08 DIAGNOSIS — R0789 Other chest pain: Secondary | ICD-10-CM | POA: Diagnosis not present

## 2021-10-08 DIAGNOSIS — I200 Unstable angina: Secondary | ICD-10-CM

## 2021-10-08 LAB — COMPREHENSIVE METABOLIC PANEL
ALT: 31 U/L (ref 0–53)
AST: 26 U/L (ref 0–37)
Albumin: 4.5 g/dL (ref 3.5–5.2)
Alkaline Phosphatase: 76 U/L (ref 39–117)
BUN: 15 mg/dL (ref 6–23)
CO2: 27 mEq/L (ref 19–32)
Calcium: 9.5 mg/dL (ref 8.4–10.5)
Chloride: 103 mEq/L (ref 96–112)
Creatinine, Ser: 1.21 mg/dL (ref 0.40–1.50)
GFR: 67.01 mL/min (ref >60.00)
Glucose, Bld: 104 mg/dL — ABNORMAL HIGH (ref 70–99)
Potassium: 5.2 mEq/L — ABNORMAL HIGH (ref 3.5–5.1)
Sodium: 136 mEq/L (ref 135–145)
Total Bilirubin: 0.6 mg/dL (ref 0.2–1.2)
Total Protein: 7.5 g/dL (ref 6.0–8.3)

## 2021-10-08 LAB — CBC WITH DIFFERENTIAL/PLATELET
Basophils Absolute: 0 10*3/uL (ref 0.0–0.1)
Basophils Relative: 0.1 % (ref 0.0–3.0)
Eosinophils Absolute: 0.1 10*3/uL (ref 0.0–0.7)
Eosinophils Relative: 1 % (ref 0.0–5.0)
HCT: 45.4 % (ref 39.0–52.0)
Hemoglobin: 15.2 g/dL (ref 13.0–17.0)
Lymphocytes Relative: 34.6 % (ref 12.0–46.0)
Lymphs Abs: 2.3 10*3/uL (ref 0.7–4.0)
MCHC: 33.6 g/dL (ref 30.0–36.0)
MCV: 89.4 fl (ref 78.0–100.0)
Monocytes Absolute: 0.9 10*3/uL (ref 0.1–1.0)
Monocytes Relative: 14 % — ABNORMAL HIGH (ref 3.0–12.0)
Neutro Abs: 3.3 10*3/uL (ref 1.4–7.7)
Neutrophils Relative %: 50.3 % (ref 43.0–77.0)
Platelets: 223 10*3/uL (ref 150.0–400.0)
RBC: 5.08 Mil/uL (ref 4.22–5.81)
RDW: 13.1 % (ref 11.5–15.5)
WBC: 6.6 10*3/uL (ref 4.0–10.5)

## 2021-10-08 LAB — TROPONIN I (HIGH SENSITIVITY): High Sens Troponin I: 7 ng/L (ref 2–17)

## 2021-10-08 MED ORDER — NITROGLYCERIN 0.4 MG SL SUBL
0.4000 mg | SUBLINGUAL_TABLET | SUBLINGUAL | 3 refills | Status: AC | PRN
Start: 2021-10-08 — End: ?
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
## 2021-10-08 NOTE — Patient Instructions (Signed)
STAT labs today ?EKG stable compared to your last one ?Nitroglycerin sent in for as needed use as we discussed. Attaching information sheet.  ?Cardiology consult placed.  ? ?If chest pain/pressure worsens, you have trouble breathing, sweating, nausea, vomiting, pain in your arm/jaw, dizziness, etc. GO TO THE EMERGENCY DEPARTMENT! ? ?

## 2021-10-13 ENCOUNTER — Ambulatory Visit: Payer: 59 | Admitting: Cardiology

## 2021-10-13 ENCOUNTER — Encounter: Payer: Self-pay | Admitting: Cardiology

## 2021-10-13 ENCOUNTER — Other Ambulatory Visit: Payer: Self-pay

## 2021-10-13 VITALS — BP 158/84 | HR 97 | Ht 72.0 in | Wt 210.0 lb

## 2021-10-13 DIAGNOSIS — I4510 Unspecified right bundle-branch block: Secondary | ICD-10-CM

## 2021-10-13 DIAGNOSIS — R0609 Other forms of dyspnea: Secondary | ICD-10-CM | POA: Diagnosis not present

## 2021-10-13 DIAGNOSIS — E785 Hyperlipidemia, unspecified: Secondary | ICD-10-CM

## 2021-10-13 DIAGNOSIS — R0789 Other chest pain: Secondary | ICD-10-CM | POA: Insufficient documentation

## 2021-10-13 DIAGNOSIS — I10 Essential (primary) hypertension: Secondary | ICD-10-CM

## 2021-10-13 DIAGNOSIS — R079 Chest pain, unspecified: Secondary | ICD-10-CM

## 2021-10-13 MED ORDER — METOPROLOL TARTRATE 100 MG PO TABS: 100.0000 mg | ORAL_TABLET | Freq: Once | ORAL | 0 refills | Status: DC

## 2021-10-13 MED ORDER — ASPIRIN EC 81 MG PO TBEC: 81.0000 mg | DELAYED_RELEASE_TABLET | Freq: Every day | ORAL | 3 refills | Status: AC

## 2021-10-13 NOTE — Patient Instructions (Signed)
Medication Instructions:  ?Your physician has recommended you make the following change in your medication:  ? ?START: Aspirin 81 mg daily ? ?*If you need a refill on your cardiac medications before your next appointment, please call your pharmacy* ? ? ?Lab Work: ? ?BMP 1 week before CT ? ?If you have labs (blood work) drawn today and your tests are completely normal, you will receive your results only by: ?MyChart Message (if you have MyChart) OR ?A paper copy in the mail ?If you have any lab test that is abnormal or we need to change your treatment, we will call you to review the results. ? ? ?Testing/Procedures: ?Your physician has requested that you have an echocardiogram. Echocardiography is a painless test that uses sound waves to create images of your heart. It provides your doctor with information about the size and shape of your heart and how well your heart?s chambers and valves are working. This procedure takes approximately one hour. There are no restrictions for this procedure. ? ? ? ?Your cardiac CT will be scheduled at one of the below locations:  ? ?[facility] ?[address] ?[place], [place] [address] ?[phone] ? ?OR ? ?[facility] ?[address] ?[address] ?[place], [place] [address] ?[phone] ? ?If scheduled at [facility], please arrive at the [facility] and Children's Entrance (Entrance C2) of [facility] 30 minutes prior to test start time. ?You can use the FREE valet parking offered at entrance C (encouraged to control the heart rate for the test)  ?Proceed to the [facility] Radiology Department (first floor) to check-in and test prep. ? ?All radiology patients and guests should use entrance C2 at [facility], accessed from [facility], even though the hospital's physical address listed is [address]. ? ? ? ?If scheduled at [facility], please arrive 15 mins early  for check-in and test prep. ? ?Please follow these instructions carefully (unless otherwise directed): ? ?Hold all erectile dysfunction medications at least 3 days (72 hrs) prior to test. ? ?On the Night Before the Test: ?Be sure to Drink plenty of water. ?Do not consume any caffeinated/decaffeinated beverages or chocolate 12 hours prior to your test. ?Do not take any antihistamines 12 hours prior to your test. ? ?On the Day of the Test: ?Drink plenty of water until 1 hour prior to the test. ?Do not eat any food 4 hours prior to the test. ?You may take your regular medications prior to the test.  ?Take metoprolol (Lopressor) two hours prior to test. ? ?     ?After the Test: ?Drink plenty of water. ?After receiving IV contrast, you may experience a mild flushed feeling. This is normal. ?On occasion, you may experience a mild rash up to 24 hours after the test. This is not dangerous. If this occurs, you can take Benadryl 25 mg and increase your fluid intake. ?If you experience trouble breathing, this can be serious. If it is severe call 911 IMMEDIATELY. If it is mild, please call our office. ? ?We will call to schedule your test 2-4 weeks out understanding that some insurance companies will need an authorization prior to the service being performed.  ? ?For non-scheduling related questions, please contact the cardiac imaging nurse navigator should you have any questions/concerns: ?Marchia Bond, Cardiac Imaging Nurse Navigator ?Gordy Clement, Cardiac Imaging Nurse Navigator ?[facility] ?Direct Office Dial: [phone]  ? ?For scheduling  needs, including cancellations and rescheduling, please call [place], [phone]. ? ? ? ? ?Follow-Up: ?At [facility], you and your health needs are our priority.  As part of our continuing mission to provide you with exceptional heart care, we have created designated Provider Care Teams.  These Care Teams include your primary Cardiologist (physician)  and Advanced Practice Providers (APPs -  Physician Assistants and Nurse Practitioners) who all work together to provide you with the care you need, when you need it. ? ?We recommend signing up for the patient portal called "MyChart".  Sign up information is provided on this After Visit Summary.  MyChart is used to connect with patients for Virtual Visits (Telemedicine).  Patients are able to view lab/test results, encounter notes, upcoming appointments, etc.  Non-urgent messages can be sent to your provider as well.   ?To learn more about what you can do with MyChart, go to [email or web address].   ? ?Your next appointment:   ? 2 month(s) ? ?The format for your next appointment:   ?In Person ? ?Provider:   ?Jenne Campus, MD  ? ? ?Other Instructions ?None ? ?
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
## 2021-10-16 ENCOUNTER — Other Ambulatory Visit: Payer: Self-pay

## 2021-10-16 ENCOUNTER — Ambulatory Visit (HOSPITAL_BASED_OUTPATIENT_CLINIC_OR_DEPARTMENT_OTHER)
Admission: RE | Admit: 2021-10-16 | Discharge: 2021-10-16 | Disposition: A | Discharge status: Home / Self Care | Payer: 59 | Source: Ambulatory Visit | Attending: Cardiology | Admitting: Cardiology

## 2021-10-16 DIAGNOSIS — E785 Hyperlipidemia, unspecified: Secondary | ICD-10-CM | POA: Diagnosis present

## 2021-10-16 DIAGNOSIS — R0609 Other forms of dyspnea: Secondary | ICD-10-CM | POA: Insufficient documentation

## 2021-10-16 DIAGNOSIS — R079 Chest pain, unspecified: Secondary | ICD-10-CM | POA: Insufficient documentation

## 2021-10-16 DIAGNOSIS — I10 Essential (primary) hypertension: Secondary | ICD-10-CM | POA: Diagnosis not present

## 2021-10-16 DIAGNOSIS — I4510 Unspecified right bundle-branch block: Secondary | ICD-10-CM | POA: Insufficient documentation

## 2021-10-16 DIAGNOSIS — R0789 Other chest pain: Secondary | ICD-10-CM | POA: Diagnosis not present

## 2021-10-16 LAB — ECHOCARDIOGRAM COMPLETE
Area-P 1/2: 4.68 cm2
S' Lateral: 3.7 cm

## 2021-10-16 NOTE — Progress Notes (Signed)
?  Echocardiogram ?2D Echocardiogram has been performed. ? ?Danny Henderson ?10/16/2021, 8:11 AM ?

## 2021-10-21 ENCOUNTER — Ambulatory Visit: Payer: 59 | Admitting: Cardiology

## 2021-10-23 ENCOUNTER — Telehealth (HOSPITAL_COMMUNITY): Payer: Self-pay | Admitting: Emergency Medicine

## 2021-10-23 LAB — BASIC METABOLIC PANEL
BUN/Creatinine Ratio: 13 (ref 9–20)
BUN: 15 mg/dL (ref 6–24)
CO2: 24 mmol/L (ref 20–29)
Calcium: 9.3 mg/dL (ref 8.7–10.2)
Chloride: 101 mmol/L (ref 96–106)
Creatinine, Ser: 1.17 mg/dL (ref 0.76–1.27)
Glucose: 98 mg/dL (ref 70–99)
Potassium: 4.4 mmol/L (ref 3.5–5.2)
Sodium: 137 mmol/L (ref 134–144)
eGFR: 73 mL/min/{1.73_m2} (ref >59)

## 2021-10-23 NOTE — Telephone Encounter (Signed)
Unable to leave vm (full) ?Marchia Bond RN Navigator Cardiac Imaging ?[facility] ?[phone] Office  ?[phone] Cell ? ?

## 2021-10-26 ENCOUNTER — Ambulatory Visit (HOSPITAL_COMMUNITY)
Admission: RE | Admit: 2021-10-26 | Discharge: 2021-10-26 | Disposition: A | Discharge status: Home / Self Care | Payer: 59 | Source: Ambulatory Visit | Attending: Cardiology | Admitting: Cardiology

## 2021-10-26 ENCOUNTER — Encounter (HOSPITAL_COMMUNITY): Payer: Self-pay

## 2021-10-26 ENCOUNTER — Other Ambulatory Visit: Payer: Self-pay

## 2021-10-26 DIAGNOSIS — R079 Chest pain, unspecified: Secondary | ICD-10-CM | POA: Insufficient documentation

## 2021-10-26 MED ORDER — NITROGLYCERIN 0.4 MG SL SUBL
SUBLINGUAL_TABLET | SUBLINGUAL | Status: AC
  Filled 2021-10-26: qty 2

## 2021-10-26 MED ORDER — NITROGLYCERIN 0.4 MG SL SUBL
0.8000 mg | SUBLINGUAL_TABLET | Freq: Once | SUBLINGUAL | Status: AC
  Administered 2021-10-26: 0.8 mg via SUBLINGUAL

## 2021-10-26 MED ORDER — IOHEXOL 350 MG/ML SOLN
100.0000 mL | Freq: Once | INTRAVENOUS | Status: AC | PRN
Start: 2021-10-26 — End: 2021-10-26
  Administered 2021-10-26: 100 mL via INTRAVENOUS

## 2021-11-04 ENCOUNTER — Ambulatory Visit: Payer: 59 | Admitting: Neurology

## 2021-11-04 DIAGNOSIS — G4733 Obstructive sleep apnea (adult) (pediatric): Secondary | ICD-10-CM

## 2021-11-04 DIAGNOSIS — I4510 Unspecified right bundle-branch block: Secondary | ICD-10-CM

## 2021-11-04 DIAGNOSIS — J301 Allergic rhinitis due to pollen: Secondary | ICD-10-CM

## 2021-11-05 ENCOUNTER — Telehealth: Payer: Self-pay

## 2021-11-05 NOTE — Telephone Encounter (Signed)
-----   Message from Park Liter, MD sent at 10/28/2021  7:42 PM EDT ----- ?Coronary CT angio showed only minimal nonobstructive disease ?

## 2021-11-05 NOTE — Telephone Encounter (Signed)
-----   Message from Park Liter, MD sent at 10/26/2021 11:20 AM EDT ----- ?Chem-7 looks good, proceed with coronary CT angio ?

## 2021-11-05 NOTE — Telephone Encounter (Signed)
Patient notified of results.

## 2021-11-26 ENCOUNTER — Ambulatory Visit: Payer: 59 | Admitting: Cardiology

## 2021-12-02 ENCOUNTER — Telehealth: Payer: Self-pay | Admitting: Neurology

## 2021-12-02 NOTE — Telephone Encounter (Signed)
Pt cancelled appt due to scheduling conflict. Transferred to billing. ?

## 2021-12-09 ENCOUNTER — Ambulatory Visit (INDEPENDENT_AMBULATORY_CARE_PROVIDER_SITE_OTHER): Payer: 59 | Admitting: Neurology

## 2021-12-09 DIAGNOSIS — G4733 Obstructive sleep apnea (adult) (pediatric): Secondary | ICD-10-CM

## 2021-12-09 DIAGNOSIS — I4510 Unspecified right bundle-branch block: Secondary | ICD-10-CM

## 2021-12-09 DIAGNOSIS — J301 Allergic rhinitis due to pollen: Secondary | ICD-10-CM

## 2021-12-10 ENCOUNTER — Ambulatory Visit: Payer: 59 | Admitting: Cardiology

## 2021-12-14 NOTE — Progress Notes (Signed)
? ? ? ? ? ? ? ?  ?  ?[facility] ?  ?HOME SLEEP TEST REPORT ( by Watch PAT)   ?STUDY DATE:  12-14-2021 ?  ?ORDERING CLINICIAN: Larey Seat, MD  ?REFERRING CLINICIAN:  ?  ?CLINICAL INFORMATION/HISTORY:  ? CPAP 2017. [facility] -   ?  ?  ?HISTORY OF PRESENT ILLNESS:  ?Danny Henderson is a 56 - year -old Caucasian male patient and is seen in a RV on 10/07/2021  ?10-07-2021; he had Covid 19 in early 2022, no symptoms, but tested because of exposure. Danny Henderson machine is now over 62 years old and he would be in line for aa new CPAP machine.    Usually, I want to obtain a baseline by home sleep test. ?He has been 100% compliant by days and hours with an average use at time of 8 hours 20 minutes.  The AutoSet has a serial #[id number] , AirSense 10 by [employer].  The minimum pressure is 5 and the maximum 15 cm water with 3 cm EPR on which the patient feels comfortable.  ?His residual AHI is only 2.2/h = this is an excellent resolution of apnea based on the baseline of 5 years ago.  He had 0.4 centrals and 1.2 obstructive.  The 95th percentile pressure is 12.3 cm water. ?  ?Epworth Score; 5/ 24 points. FSS at 14 / 63 points.   ?BMI: 28.7 kg/m? ?Neck Circumference: 17" ?  ?Sleep Summary: ?  ?Total Recording Time (hours, min):   6 hours and 45 minutes of which 6 hours and 15 minutes were the estimated total sleep time.  Percent REM (%):  REM sleep time was 24.4%                                     ?  ?Respiratory Indices: ?Calculated pAHI (per hour): Total apnea hypopnea index for this home sleep test was 17/h there was a slight raised during REM sleep to 20/h and during non-REM sleep the AHI was 15.9/h.                          ?Positional AHI: The patient mainly slept on his left side with an AHI of 10.2/h followed by 107 minutes in supine sleep position associated with an AHI of 33.7/h. ? ?Snoring mean volume was 40 dB and accompanied 27% of total sleep time -this is mild to moderate snoring.                                                 ?  ?Oxygen Saturation Statistics: ? O2 Saturation Range (%):   Varied between a nadir at 87 and a maximum of 99% with a mean saturation of 94%.   O2 Saturation (minutes) <89%:   0.3 minutes      ?  ?Pulse Rate Statistics: ? Pulse Range: Varied between 54 and 92 bpm with a mean heart rate of 64 beats to 4 bpm.   ?Please note that this home sleep test device can only give cardiac rate data and no information about cardiac rhythm. ?It is also not possible to evaluate for periodic limb movements, nocturnal seizures, CO2 retention.             ?  ?  IMPRESSION:  This HST confirms the presence of mild to moderate sleep apnea which is not REM sleep dependent but strongly dependent on sleep position. ?Avoiding supine sleep is important.   ?RECOMMENDATION:I will order a new ResMed auto titration CPAP machine for this patient who has been highly compliant in the past.   ?The minimum pressure will be 6 cm H20, the maximum pressure 16 cmH2O with 3 cm expiratory pressure relief and his choice of interface and humidification setting. ?  ?INTERPRETING PHYSICIAN: ? ? Larey Seat, MD  ? ?Medical Director of [facility] [employer].  ? ? ? ? ? ? ? ? ? ? ? ? ? ? ? ?

## 2021-12-17 ENCOUNTER — Other Ambulatory Visit: Payer: Self-pay | Admitting: Neurology

## 2021-12-17 ENCOUNTER — Telehealth: Payer: Self-pay | Admitting: Neurology

## 2021-12-17 DIAGNOSIS — G4733 Obstructive sleep apnea (adult) (pediatric): Secondary | ICD-10-CM

## 2021-12-17 DIAGNOSIS — J301 Allergic rhinitis due to pollen: Secondary | ICD-10-CM

## 2021-12-17 DIAGNOSIS — I4510 Unspecified right bundle-branch block: Secondary | ICD-10-CM

## 2021-12-17 NOTE — Telephone Encounter (Signed)
I called pt. I advised pt that Dr. Brett Fairy reviewed their sleep study results and found that pt has sleep apnea. Dr. Brett Fairy recommends that pt continue treatment with CPAP and will order a new auto CPAP. I reviewed PAP compliance expectations with the pt. Pt is agreeable to starting a CPAP. I advised pt that an order will be sent to a DME, [facility], and [facility] will call the pt within about one week after they file with the pt's insurance. [facility] will show the pt how to use the machine, fit for masks, and troubleshoot the CPAP if needed. A follow up appt was made for insurance purposes with Debbora Presto, NP on 03/04/2022 at 9 am. Pt verbalized understanding to arrive 15 minutes early and bring their CPAP. A letter with all of this information in it will be mailed to the pt as a reminder. I verified with the pt that the address we have on file is correct. Pt verbalized understanding of results. Pt had no questions at this time but was encouraged to call back if questions arise. I have sent the order to [facility] and have received confirmation that they have received the order. ? ?

## 2021-12-17 NOTE — Telephone Encounter (Signed)
-----   Message from Larey Seat, MD sent at 12/17/2021 12:51 PM EDT ----- ?I will order a new ResMed auto titration CPAP machine for this patient who has been highly compliant in the past.   ?The minimum pressure will be 6 cm H20, the maximum pressure 16 cmH2O with 3 cm expiratory pressure relief and his choice of interface and humidification setting. ? ?

## 2021-12-17 NOTE — Procedures (Signed)
? ? ? ?  ?  ?[facility] ?  ?HOME SLEEP TEST REPORT ( by Watch PAT)   ?STUDY DATE:  12-14-2021 ?  ?ORDERING CLINICIAN: Larey Seat, MD  ?REFERRING CLINICIAN:  ?  ?CLINICAL INFORMATION/HISTORY:  ? CPAP 2017. [facility] -   ?  ?  ?HISTORY OF PRESENT ILLNESS:  ?Danny Henderson is a 56 - year -old Caucasian male patient and is seen in a RV on 10/07/2021  ?10-07-2021; he had Covid 19 in early 2022, no symptoms, but tested because of exposure. Mr. Shew machine is now over 34 years old and he would be in line for aa new CPAP machine.    Usually, I want to obtain a baseline by home sleep test. ?He has been 100% compliant by days and hours with an average use at time of 8 hours 20 minutes.  The AutoSet has a serial #[id number] , AirSense 10 by [employer].  The minimum pressure is 5 and the maximum 15 cm water with 3 cm EPR on which the patient feels comfortable.  ?His residual AHI is only 2.2/h = this is an excellent resolution of apnea based on the baseline of 5 years ago.  He had 0.4 centrals and 1.2 obstructive.  The 95th percentile pressure is 12.3 cm water. ?  ?Epworth Score; 5/ 24 points. FSS at 14 / 63 points.   ?BMI: 28.7 kg/m? ?Neck Circumference: 17" ?  ?Sleep Summary: ?  ?Total Recording Time (hours, min):   6 hours and 45 minutes of which 6 hours and 15 minutes were the estimated total sleep time.  Percent REM (%):  REM sleep time was 24.4%                                     ?  ?Respiratory Indices: ?Calculated pAHI (per hour): Total apnea hypopnea index for this home sleep test was 17/h there was a slight raised during REM sleep to 20/h and during non-REM sleep the AHI was 15.9/h.                          ?Positional AHI: The patient mainly slept on his left side with an AHI of 10.2/h followed by 107 minutes in supine sleep position associated with an AHI of 33.7/h. ? ?Snoring mean volume was 40 dB and accompanied 27% of total sleep time -this is mild to moderate snoring.                                                 ?  ?Oxygen Saturation Statistics: ? O2 Saturation Range (%):   Varied between a nadir at 87 and a maximum of 99% with a mean saturation of 94%.   O2 Saturation (minutes) <89%:   0.3 minutes      ?  ?Pulse Rate Statistics: ? Pulse Range: Varied between 54 and 92 bpm with a mean heart rate of 64 beats to 4 bpm.   ?Please note that this home sleep test device can only give cardiac rate data and no information about cardiac rhythm. ?It is also not possible to evaluate for periodic limb movements, nocturnal seizures, CO2 retention.             ?  ?  IMPRESSION:  This HST confirms the presence of mild to moderate sleep apnea which is not REM sleep dependent but strongly dependent on sleep position. ?Avoiding supine sleep is important.   ?RECOMMENDATION:I will order a new ResMed auto titration CPAP machine for this patient who has been highly compliant in the past.   ?The minimum pressure will be 6 cm H20, the maximum pressure 16 cmH2O with 3 cm expiratory pressure relief and his choice of interface and humidification setting. ?  ?INTERPRETING PHYSICIAN: ? ? Larey Seat, MD  ? ?Medical Director of [facility] [employer].  ? ? ? ? ? ? ? ? ? ? ? ?

## 2022-02-05 ENCOUNTER — Ambulatory Visit: Payer: 59 | Admitting: Cardiology

## 2022-02-05 ENCOUNTER — Encounter: Payer: Self-pay | Admitting: Cardiology

## 2022-02-05 VITALS — BP 148/82 | HR 81 | Ht 72.0 in | Wt 213.8 lb

## 2022-02-05 DIAGNOSIS — Z9989 Dependence on other enabling machines and devices: Secondary | ICD-10-CM

## 2022-02-05 DIAGNOSIS — G4733 Obstructive sleep apnea (adult) (pediatric): Secondary | ICD-10-CM | POA: Diagnosis not present

## 2022-02-05 DIAGNOSIS — R0789 Other chest pain: Secondary | ICD-10-CM

## 2022-02-05 DIAGNOSIS — R0609 Other forms of dyspnea: Secondary | ICD-10-CM

## 2022-02-05 DIAGNOSIS — I4510 Unspecified right bundle-branch block: Secondary | ICD-10-CM | POA: Diagnosis not present

## 2022-02-05 DIAGNOSIS — E785 Hyperlipidemia, unspecified: Secondary | ICD-10-CM

## 2022-02-05 DIAGNOSIS — I10 Essential (primary) hypertension: Secondary | ICD-10-CM

## 2022-02-05 MED ORDER — ATORVASTATIN CALCIUM 10 MG PO TABS: 10.0000 mg | ORAL_TABLET | Freq: Every day | ORAL | 2 refills | Status: DC

## 2022-02-05 NOTE — Patient Instructions (Signed)
Medication Instructions:  Your physician has recommended you make the following change in your medication:  Start Lipitor 10 mg once daily in the evening  *If you need a refill on your cardiac medications before your next appointment, please call your pharmacy*   Lab Work: Your physician recommends that you return for lab work in: 6 weeks for a Fasting Lipid Panel, ALT and AST Lab opens at 8am. You DO NOT NEED an appointment. Best time to come is between 8am and 12noon and between 1:30 and 4:30. If you have been asked to fast for your blood work please have nothing to eat or drink after midnight. You may have water.   If you have labs (blood work) drawn today and your tests are completely normal, you will receive your results only by: [place] (if you have MyChart) OR A paper copy in the mail If you have any lab test that is abnormal or we need to change your treatment, we will call you to review the results.   Testing/Procedures: NONE   Follow-Up: At [facility], you and your health needs are our priority.  As part of our continuing mission to provide you with exceptional heart care, we have created designated Provider Care Teams.  These Care Teams include your primary Cardiologist (physician) and Advanced Practice Providers (APPs -  Physician Assistants and Nurse Practitioners) who all work together to provide you with the care you need, when you need it.  We recommend signing up for the patient portal called "MyChart".  Sign up information is provided on this After Visit Summary.  MyChart is used to connect with patients for Virtual Visits (Telemedicine).  Patients are able to view lab/test results, encounter notes, upcoming appointments, etc.  Non-urgent messages can be sent to your provider as well.   To learn more about what you can do with MyChart, go to [email or web address].    Your next appointment:   6 month(s)  The format for your next appointment:   In  Person  Provider:   Jenne Campus, MD    Other Instructions   Important Information About Sugar
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
## 2022-02-05 NOTE — Addendum Note (Signed)
Addended by: Jerl Santos R on: 02/05/2022 12:02 PM   Modules accepted: Orders

## 2022-02-17 ENCOUNTER — Encounter: Payer: Self-pay | Admitting: Dermatology

## 2022-02-17 ENCOUNTER — Ambulatory Visit: Payer: 59 | Admitting: Dermatology

## 2022-02-17 DIAGNOSIS — Z1283 Encounter for screening for malignant neoplasm of skin: Secondary | ICD-10-CM

## 2022-02-17 DIAGNOSIS — B079 Viral wart, unspecified: Secondary | ICD-10-CM | POA: Diagnosis not present

## 2022-02-17 DIAGNOSIS — D3612 Benign neoplasm of peripheral nerves and autonomic nervous system, upper limb, including shoulder: Secondary | ICD-10-CM | POA: Diagnosis not present

## 2022-02-17 DIAGNOSIS — D3617 Benign neoplasm of peripheral nerves and autonomic nervous system of trunk, unspecified: Secondary | ICD-10-CM | POA: Diagnosis not present

## 2022-02-17 DIAGNOSIS — D485 Neoplasm of uncertain behavior of skin: Secondary | ICD-10-CM

## 2022-02-17 NOTE — Patient Instructions (Signed)

## 2022-03-03 NOTE — Patient Instructions (Signed)
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
## 2022-03-04 ENCOUNTER — Encounter: Payer: Self-pay | Admitting: Family Medicine

## 2022-03-04 ENCOUNTER — Ambulatory Visit: Payer: 59 | Admitting: Family Medicine

## 2022-03-04 VITALS — BP 150/93 | HR 86 | Ht 72.0 in | Wt 212.0 lb

## 2022-03-04 DIAGNOSIS — Z9989 Dependence on other enabling machines and devices: Secondary | ICD-10-CM

## 2022-03-04 DIAGNOSIS — G4733 Obstructive sleep apnea (adult) (pediatric): Secondary | ICD-10-CM | POA: Diagnosis not present

## 2022-03-12 ENCOUNTER — Encounter: Payer: Self-pay | Admitting: Dermatology

## 2022-03-12 NOTE — Addendum Note (Signed)
Addended by: Lavonna Monarch on: 03/12/2022 08:51 AM   Modules accepted: Level of Service

## 2022-03-12 NOTE — Progress Notes (Signed)
   New Patient   Subjective  Danny Henderson is a 56 y.o. male who presents for the following: Annual Exam (Left abdomen and left upper chest possible mole or skin tag).  General skin check, several areas of concern Location:  Duration:  Quality:  Associated Signs/Symptoms: Modifying Factors:  Severity:  Timing: Context:    The following portions of the chart were reviewed this encounter and updated as appropriate:  Tobacco  Allergies  Meds  Problems  Med Hx  Surg Hx  Fam Hx      Objective  Well appearing patient in no apparent distress; mood and affect are within normal limits. No atypical nevi (all checked with dermoscopy) or signs of NMSC noted at the time of the visit.   Left Shoulder - Anterior Pearly 5 mm pink papule     Left Abdomen (side) - Lower Partially compressible pearly 5 mm pink papule     Right Upper Arm - Posterior Verrucous 5 mm pink crust       A full examination was performed including scalp, head, eyes, ears, nose, lips, neck, chest, axillae, abdomen, back, buttocks, bilateral upper extremities, bilateral lower extremities, hands, feet, fingers, toes, fingernails, and toenails. All findings within normal limits unless otherwise noted below.   Assessment & Plan  Screening exam for skin cancer  Annual skin examination, encouraged to self examine twice annually  Neoplasm of uncertain behavior of skin (3) Left Shoulder - Anterior  Skin / nail biopsy Type of biopsy: tangential   Informed consent: discussed and consent obtained   Timeout: patient name, date of birth, surgical site, and procedure verified   Anesthesia: the lesion was anesthetized in a standard fashion   Anesthetic:  1% lidocaine w/ epinephrine 1-100,000 local infiltration Instrument used: flexible razor blade   Hemostasis achieved with: aluminum chloride and electrodesiccation   Outcome: patient tolerated procedure well   Post-procedure details: wound care  instructions given    Specimen 1 - Surgical pathology Differential Diagnosis: NEVUS  Check Margins: No  Left Abdomen (side) - Lower  Skin / nail biopsy Type of biopsy: tangential   Informed consent: discussed and consent obtained   Timeout: patient name, date of birth, surgical site, and procedure verified   Anesthesia: the lesion was anesthetized in a standard fashion   Anesthetic:  1% lidocaine w/ epinephrine 1-100,000 local infiltration Instrument used: flexible razor blade   Hemostasis achieved with: aluminum chloride and electrodesiccation   Outcome: patient tolerated procedure well   Post-procedure details: wound care instructions given    Specimen 2 - Surgical pathology Differential Diagnosis: NEVUS  Check Margins: No  Right Upper Arm - Posterior  Skin / nail biopsy Type of biopsy: tangential   Informed consent: discussed and consent obtained   Timeout: patient name, date of birth, surgical site, and procedure verified   Anesthesia: the lesion was anesthetized in a standard fashion   Anesthetic:  1% lidocaine w/ epinephrine 1-100,000 local infiltration Instrument used: flexible razor blade   Hemostasis achieved with: aluminum chloride and electrodesiccation   Outcome: patient tolerated procedure well   Post-procedure details: wound care instructions given    Specimen 3 - Surgical pathology Differential Diagnosis: NEVUS  Check Margins: No

## 2022-04-28 IMAGING — CT CT HEART MORP W/ CTA COR W/ SCORE W/ CA W/CM &/OR W/O CM
4 of 7 series · 8 of 20 positions shown, 9 images · IV contrast (APPLIED)
Comparison: 12/16/2006 CTA chest
COMPARISON: 12/16/2006 CTA chest

Addendum:
EXAM:
OVER-READ INTERPRETATION  CT CHEST

The following report is an over-read performed by radiologist Dr.
Dleke Tiger [REDACTED] on 10/26/2021. This over-read
does not include interpretation of cardiac or coronary anatomy or
pathology. The coronary CTA interpretation by the cardiologist is
attached.
CLINICAL DATA: CP
Cardiac/Coronary  CTA
TECHNIQUE: The patient was scanned on a [employer] Force scanner.

[Series 7: ts syst sharp · axial · 0.38mm/px · z∈[-202,-165]mm · 2 of 283 slices shown]
[im 95/283  lung]
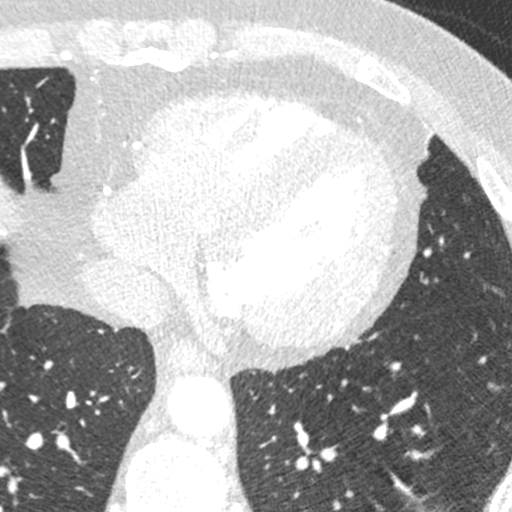
[im 189/283  lung]
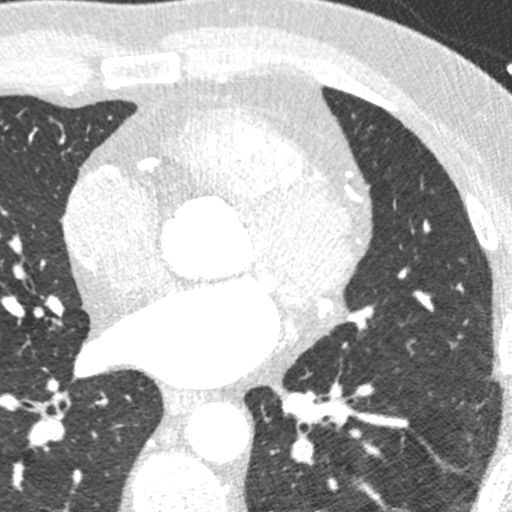

[Series 8: best syst · axial · 0.38mm/px · z∈[-202,-165]mm · 2 of 283 slices shown, 3 images]
[im 95/283  vessel]
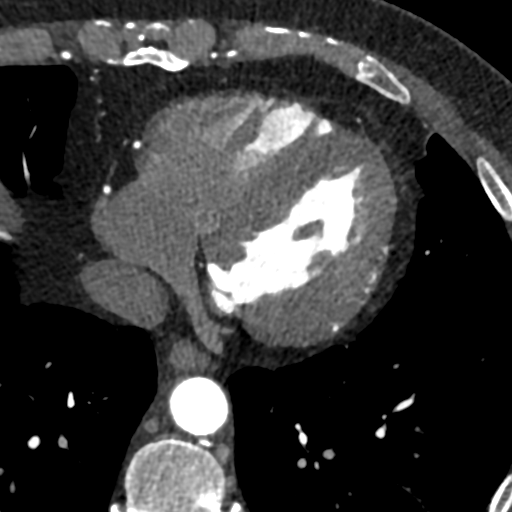
[im 95/283  lung]
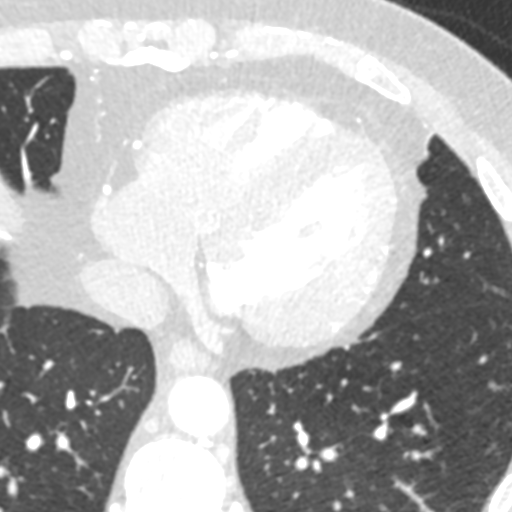
[im 189/283  vessel]
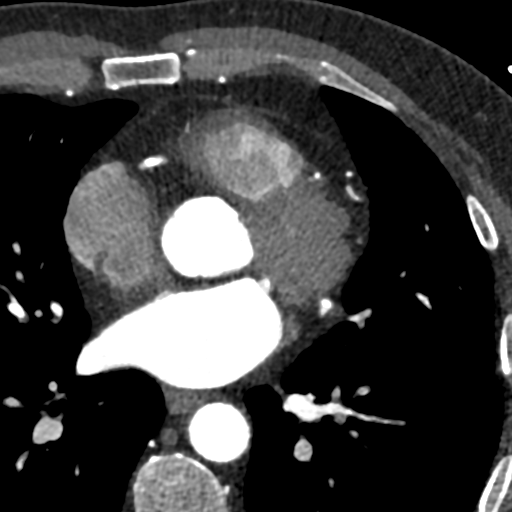

[Series 9: ts diast sharp · axial · 0.38mm/px · z∈[-202,-165]mm · 2 of 283 slices shown]
[im 95/283  lung]
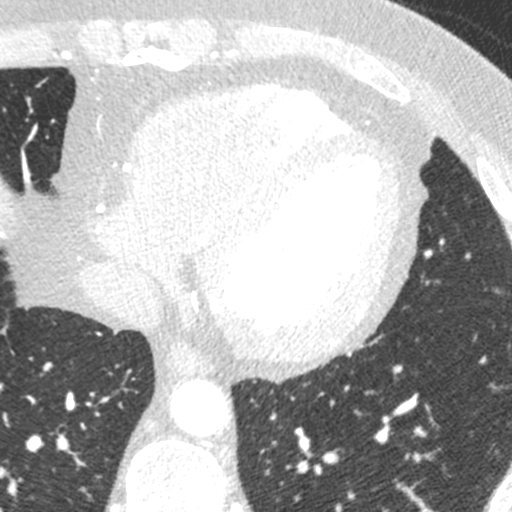
[im 189/283  lung]
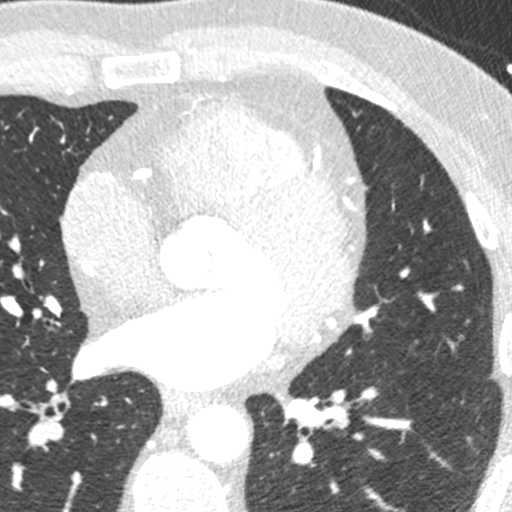

[Series 10: best diast · axial · 0.38mm/px · z∈[-202,-165]mm · 2 of 283 slices shown]
[im 95/283  vessel]
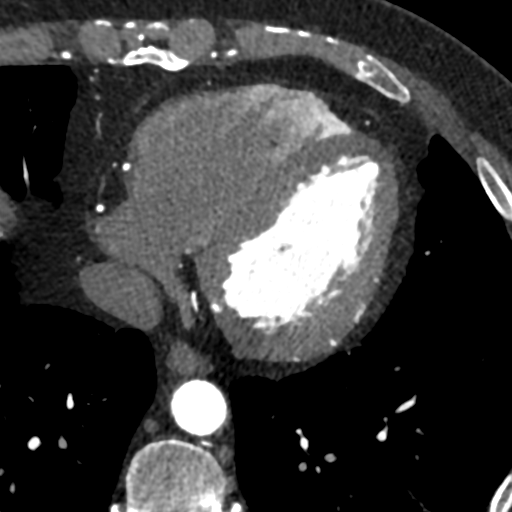
[im 189/283  vessel]
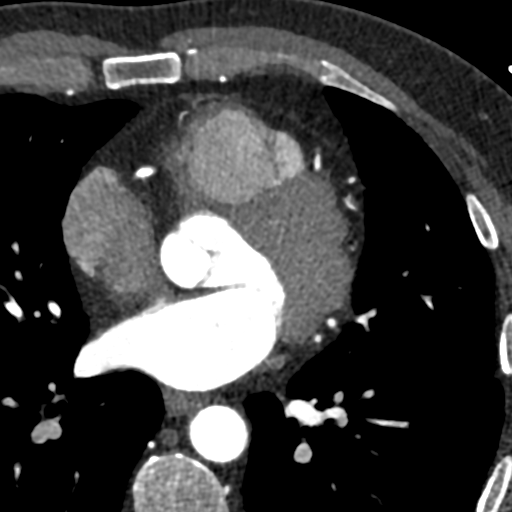

[8 of 20 positions shown; findings below may reference images not displayed]

FINDINGS: Vascular: Normal aortic caliber. No central pulmonary embolism, on
this non-dedicated study.

Mediastinum/Nodes: No imaged thoracic adenopathy.

Lungs/Pleura: No pleural fluid.  Clear imaged lungs.

Upper Abdomen: Mild hepatic steatosis. Normal imaged portions of the
stomach, spleen.

Musculoskeletal: No acute osseous abnormality.
IMPRESSION: No acute findings in the imaged extracardiac chest.

Mild hepatic steatosis.
FINDINGS: A 100 kV prospective scan was triggered in the descending thoracic
aorta at 111 HU's. Axial non-contrast 3 mm slices were carried out
through the heart. The data set was analyzed on a dedicated work
station and scored using the Agatson method. Gantry rotation speed
was 250 msecs and collimation was .6 mm. No beta blockade and 0.8 mg
of sl NTG was given. The 3D data set was reconstructed in 5%
intervals of the 67-82 % of the R-R cycle. Diastolic phases were
analyzed on a dedicated work station using MPR, MIP and VRT modes.
The patient received 80 cc of contrast.

Aorta: Normal size. Mild calcifications of the aortic arch noted. No
dissection.

Aortic Valve:  Trileaflet.  No calcifications.

Coronary Arteries:  Normal coronary origin.  Right dominance.

RCA is a large dominant artery that gives rise to PDA and PLA. There
is soft plaque in the proximal portion of this artery with minimal
stenosis of 0-25%.

Left main is a large artery that gives rise to LAD and LCX arteries.

LAD is a large vessel that has no plaque. This artery gives rise to
moderate size D1 and large D2.

LCX is a non-dominant artery that gives rise to one small OM1 and
large OM2 branches. There is small plaque in the proximal portion of
this artery with minimal stenosis of 0-25% plaque.

Other findings:

Normal pulmonary vein drainage into the left atrium. Rt middle
pulmonary vein noted - normal variant.

Normal left atrial appendage without a thrombus.

Normal size of the pulmonary artery.
IMPRESSION: 1. Coronary calcium score of 1.76 - MARKENS. This was 41 percentile for
age and sex matched control.

2. Normal coronary origin with right dominance.

3. CAD-RADS 1. Minimal non-obstructive CAD (0-24%). Consider
non-atherosclerotic causes of chest pain. Consider preventive
therapy and risk factor modification.

*** End of Addendum ***
EXAM:
OVER-READ INTERPRETATION  CT CHEST

The following report is an over-read performed by radiologist Dr.
Dleke Tiger [REDACTED] on 10/26/2021. This over-read
does not include interpretation of cardiac or coronary anatomy or
pathology. The coronary CTA interpretation by the cardiologist is
attached.
FINDINGS: Vascular: Normal aortic caliber. No central pulmonary embolism, on
this non-dedicated study.

Mediastinum/Nodes: No imaged thoracic adenopathy.

Lungs/Pleura: No pleural fluid.  Clear imaged lungs.

Upper Abdomen: Mild hepatic steatosis. Normal imaged portions of the
stomach, spleen.

Musculoskeletal: No acute osseous abnormality.
IMPRESSION: No acute findings in the imaged extracardiac chest.

Mild hepatic steatosis.

## 2022-06-08 ENCOUNTER — Encounter: Payer: Self-pay | Admitting: Gastroenterology

## 2022-08-05 ENCOUNTER — Encounter: Payer: 59 | Admitting: Internal Medicine

## 2022-08-31 ENCOUNTER — Telehealth: Payer: Self-pay | Admitting: Internal Medicine

## 2022-08-31 DIAGNOSIS — Z0000 Encounter for general adult medical examination without abnormal findings: Secondary | ICD-10-CM

## 2022-08-31 DIAGNOSIS — R739 Hyperglycemia, unspecified: Secondary | ICD-10-CM

## 2022-08-31 DIAGNOSIS — I10 Essential (primary) hypertension: Secondary | ICD-10-CM

## 2022-08-31 DIAGNOSIS — E785 Hyperlipidemia, unspecified: Secondary | ICD-10-CM

## 2022-08-31 NOTE — Telephone Encounter (Signed)
Patient's physical  is scheduled for tomorrow at 2 pm and he would like to come in in the morning to have his labs drawn so he doesn't have to fast all day. Please advise when orders placed.

## 2022-08-31 NOTE — Telephone Encounter (Signed)
Please advise 

## 2022-08-31 NOTE — Telephone Encounter (Signed)
CMP FLP CBC A1c PSA

## 2022-08-31 NOTE — Telephone Encounter (Signed)
Labs ordered. Will you schedule a lab appt at his convenience.

## 2022-09-01 ENCOUNTER — Encounter: Payer: Self-pay | Admitting: Internal Medicine

## 2022-09-01 ENCOUNTER — Other Ambulatory Visit: Payer: 59

## 2022-09-01 ENCOUNTER — Ambulatory Visit (INDEPENDENT_AMBULATORY_CARE_PROVIDER_SITE_OTHER): Payer: 59 | Admitting: Internal Medicine

## 2022-09-01 VITALS — BP 144/90 | HR 92 | Temp 97.9°F | Resp 16 | Ht 72.0 in | Wt 211.2 lb

## 2022-09-01 DIAGNOSIS — R739 Hyperglycemia, unspecified: Secondary | ICD-10-CM | POA: Diagnosis not present

## 2022-09-01 DIAGNOSIS — Z0000 Encounter for general adult medical examination without abnormal findings: Secondary | ICD-10-CM

## 2022-09-01 DIAGNOSIS — I10 Essential (primary) hypertension: Secondary | ICD-10-CM

## 2022-09-01 DIAGNOSIS — E785 Hyperlipidemia, unspecified: Secondary | ICD-10-CM

## 2022-09-01 LAB — HEMOGLOBIN A1C: Hgb A1c MFr Bld: 6.3 % (ref 4.6–6.5)

## 2022-09-01 LAB — COMPREHENSIVE METABOLIC PANEL
ALT: 51 U/L (ref 0–53)
AST: 34 U/L (ref 0–37)
Albumin: 4.6 g/dL (ref 3.5–5.2)
Alkaline Phosphatase: 75 U/L (ref 39–117)
BUN: 16 mg/dL (ref 6–23)
CO2: 30 mEq/L (ref 19–32)
Calcium: 9.7 mg/dL (ref 8.4–10.5)
Chloride: 104 mEq/L (ref 96–112)
Creatinine, Ser: 1.07 mg/dL (ref 0.40–1.50)
GFR: 77.17 mL/min (ref >60.00)
Glucose, Bld: 94 mg/dL (ref 70–99)
Potassium: 4.8 mEq/L (ref 3.5–5.1)
Sodium: 141 mEq/L (ref 135–145)
Total Bilirubin: 0.5 mg/dL (ref 0.2–1.2)
Total Protein: 7.1 g/dL (ref 6.0–8.3)

## 2022-09-01 LAB — CBC WITH DIFFERENTIAL/PLATELET
Basophils Absolute: 0 10*3/uL (ref 0.0–0.1)
Basophils Relative: 0.3 % (ref 0.0–3.0)
Eosinophils Absolute: 0.1 10*3/uL (ref 0.0–0.7)
Eosinophils Relative: 0.7 % (ref 0.0–5.0)
HCT: 45.1 % (ref 39.0–52.0)
Hemoglobin: 15 g/dL (ref 13.0–17.0)
Lymphocytes Relative: 33.6 % (ref 12.0–46.0)
Lymphs Abs: 2.5 10*3/uL (ref 0.7–4.0)
MCHC: 33.3 g/dL (ref 30.0–36.0)
MCV: 91.2 fl (ref 78.0–100.0)
Monocytes Absolute: 1 10*3/uL (ref 0.1–1.0)
Monocytes Relative: 12.8 % — ABNORMAL HIGH (ref 3.0–12.0)
Neutro Abs: 4 10*3/uL (ref 1.4–7.7)
Neutrophils Relative %: 52.6 % (ref 43.0–77.0)
Platelets: 230 10*3/uL (ref 150.0–400.0)
RBC: 4.94 Mil/uL (ref 4.22–5.81)
RDW: 13.8 % (ref 11.5–15.5)
WBC: 7.5 10*3/uL (ref 4.0–10.5)

## 2022-09-01 LAB — LIPID PANEL
Cholesterol: 201 mg/dL — ABNORMAL HIGH (ref 0–200)
HDL: 55 mg/dL (ref >39.00)
LDL Cholesterol: 123 mg/dL — ABNORMAL HIGH (ref 0–99)
NonHDL: 146.38
Total CHOL/HDL Ratio: 4
Triglycerides: 116 mg/dL (ref 0.0–149.0)
VLDL: 23.2 mg/dL (ref 0.0–40.0)

## 2022-09-01 LAB — PSA: PSA: 0.2 ng/mL (ref 0.10–4.00)

## 2022-09-01 MED ORDER — LOSARTAN POTASSIUM 50 MG PO TABS: ORAL_TABLET | ORAL | 1 refills | Status: DC

## 2022-09-01 MED ORDER — ATORVASTATIN CALCIUM 20 MG PO TABS: 20.0000 mg | ORAL_TABLET | Freq: Every day | ORAL | 1 refills | Status: DC
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
## 2022-09-01 NOTE — Assessment & Plan Note (Signed)
Here for CPX, labs done today reviewed Hyperglycemia: A1c satisfactory, a healthy lifestyle is encouraged HTN: BP elevated, ran out of losartan approximately 2 months ago, RF sent.  Recheck labs in 2 months Chest pain: Saw cardiology 01/2022, echo with normal EF, coronary CT angio: Minimal narrowing at the RCA and proximal circumflex. Was Rx to take aspirin, healthy lifestyle, and Lipitor 10 mg High cholesterol: Started Lipitor 10 mg few months ago, LDL actually increased from 117-123.  Plan: Increase Lipitor to 20 mg, labs in 2 months. GERD: Episodic symptoms, control with OTC PPIs.  Encouraged also avoid GERD triggers. OSA: Good CPAP compliance RTC labs 2 months, RTC CPX 1 year as long as BP and cholesterol control.

## 2022-09-01 NOTE — Assessment & Plan Note (Signed)
Labs done today, reviewed. -Td 08/04/2020 - shingrix: rec - Covid vax  booster  rec -Flu shot rec  -Colon cancer screening: Colonoscopy 07/2017, + polyp, per GI letter repeat in 10 years -prostate cancer screening: DRE declined, PSA is normal - (+) FH CAD: plan is to control CVRF - Diet exercise: Doing well

## 2022-09-01 NOTE — Patient Instructions (Addendum)
Go back on losartan and continue checking your blood pressures regularly  BP GOAL is between 110/65 and  135/85. If it is consistently higher or lower, let me know   Increase atorvastatin from 10 mg to 20 mg   Seems to consider: Shingrix, COVID and flu shot  GO TO THE FRONT DESK, [place] back for blood work in 2 months  Come back for physical exam in 1 year.  Sooner if blood pressure and cholesterol not controlled.     "[place] of attorney" ,  "Living will" (Advance care planning documents)  If you already have a living will or healthcare power of attorney, is recommended you bring the copy to be scanned in your chart.   The document will be available to all the doctors you see in the system.  Advance care planning is a process that supports adults in  understanding and sharing their preferences regarding future medical care.  The patient's preferences are recorded in documents called Advance Directives and the can be modified at any time while the patient is in full mental capacity.   If you don't have one, please consider create one.      More information at: [email or web address] in 2 months

## 2022-11-01 ENCOUNTER — Other Ambulatory Visit (INDEPENDENT_AMBULATORY_CARE_PROVIDER_SITE_OTHER): Payer: 59

## 2022-11-01 DIAGNOSIS — E785 Hyperlipidemia, unspecified: Secondary | ICD-10-CM

## 2022-11-01 LAB — COMPREHENSIVE METABOLIC PANEL
ALT: 28 U/L (ref 0–53)
AST: 22 U/L (ref 0–37)
Albumin: 4.5 g/dL (ref 3.5–5.2)
Alkaline Phosphatase: 82 U/L (ref 39–117)
BUN: 14 mg/dL (ref 6–23)
CO2: 28 mEq/L (ref 19–32)
Calcium: 9.6 mg/dL (ref 8.4–10.5)
Chloride: 103 mEq/L (ref 96–112)
Creatinine, Ser: 1.07 mg/dL (ref 0.40–1.50)
GFR: 77.08 mL/min (ref >60.00)
Glucose, Bld: 93 mg/dL (ref 70–99)
Potassium: 4.4 mEq/L (ref 3.5–5.1)
Sodium: 140 mEq/L (ref 135–145)
Total Bilirubin: 0.4 mg/dL (ref 0.2–1.2)
Total Protein: 7.1 g/dL (ref 6.0–8.3)

## 2022-11-01 LAB — LIPID PANEL
Cholesterol: 178 mg/dL (ref 0–200)
HDL: 49.6 mg/dL (ref >39.00)
LDL Cholesterol: 96 mg/dL (ref 0–99)
NonHDL: 128.51
Total CHOL/HDL Ratio: 4
Triglycerides: 161 mg/dL — ABNORMAL HIGH (ref 0.0–149.0)
VLDL: 32.2 mg/dL (ref 0.0–40.0)

## 2022-11-03 MED ORDER — ATORVASTATIN CALCIUM 20 MG PO TABS: 20.0000 mg | ORAL_TABLET | Freq: Every day | ORAL | 3 refills | Status: DC

## 2022-11-03 NOTE — Addendum Note (Signed)
Addended byDamita Dunnings D on: 11/03/2022 09:31 AM   Modules accepted: Orders

## 2022-11-30 ENCOUNTER — Encounter: Payer: Self-pay | Admitting: Cardiology

## 2022-11-30 ENCOUNTER — Ambulatory Visit: Payer: 59 | Attending: Cardiology | Admitting: Cardiology

## 2022-11-30 VITALS — BP 144/90 | HR 90 | Ht 72.0 in | Wt 215.6 lb

## 2022-11-30 DIAGNOSIS — I10 Essential (primary) hypertension: Secondary | ICD-10-CM

## 2022-11-30 DIAGNOSIS — G4733 Obstructive sleep apnea (adult) (pediatric): Secondary | ICD-10-CM

## 2022-11-30 DIAGNOSIS — R0789 Other chest pain: Secondary | ICD-10-CM

## 2022-11-30 DIAGNOSIS — I4510 Unspecified right bundle-branch block: Secondary | ICD-10-CM

## 2022-11-30 DIAGNOSIS — E785 Hyperlipidemia, unspecified: Secondary | ICD-10-CM
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
## 2022-11-30 NOTE — Patient Instructions (Addendum)
Medication Instructions:  Your physician recommends that you continue on your current medications as directed. Please refer to the Current Medication list given to you today.  *If you need a refill on your cardiac medications before your next appointment, please call your pharmacy*   Lab Work: Your physician recommends that you return for lab work in: fasting You need to have labs done when you are fasting.  You can come Monday through Friday 8:30 am to 12:00 pm and 1:15 to 4:30. You do not need to make an appointment as the order has already been placed. The labs you are going to have done are  Lipids.    Testing/Procedures: None Ordered   Follow-Up: At [facility], you and your health needs are our priority.  As part of our continuing mission to provide you with exceptional heart care, we have created designated Provider Care Teams.  These Care Teams include your primary Cardiologist (physician) and Advanced Practice Providers (APPs -  Physician Assistants and Nurse Practitioners) who all work together to provide you with the care you need, when you need it.  We recommend signing up for the patient portal called "MyChart".  Sign up information is provided on this After Visit Summary.  MyChart is used to connect with patients for Virtual Visits (Telemedicine).  Patients are able to view lab/test results, encounter notes, upcoming appointments, etc.  Non-urgent messages can be sent to your provider as well.   To learn more about what you can do with MyChart, go to [email or web address].    Your next appointment:   12 month(s)  The format for your next appointment:   In Person  Provider:   Gypsy Balsam, MD    Other Instructions NA

## 2022-11-30 NOTE — Addendum Note (Signed)
Addended by: Baldo Ash D on: 11/30/2022 03:16 PM   Modules accepted: Orders

## 2023-09-06 ENCOUNTER — Encounter: Payer: Self-pay | Admitting: Internal Medicine

## 2023-09-06 ENCOUNTER — Ambulatory Visit: Payer: 59 | Admitting: Internal Medicine

## 2023-09-06 VITALS — BP 122/80 | HR 81 | Temp 97.7°F | Resp 16 | Ht 72.0 in | Wt 216.4 lb

## 2023-09-06 DIAGNOSIS — R739 Hyperglycemia, unspecified: Secondary | ICD-10-CM | POA: Diagnosis not present

## 2023-09-06 DIAGNOSIS — Z0000 Encounter for general adult medical examination without abnormal findings: Secondary | ICD-10-CM

## 2023-09-06 DIAGNOSIS — Z0001 Encounter for general adult medical examination with abnormal findings: Secondary | ICD-10-CM | POA: Diagnosis not present

## 2023-09-06 DIAGNOSIS — E785 Hyperlipidemia, unspecified: Secondary | ICD-10-CM | POA: Diagnosis not present

## 2023-09-06 DIAGNOSIS — I10 Essential (primary) hypertension: Secondary | ICD-10-CM

## 2023-09-06 LAB — BASIC METABOLIC PANEL
BUN: 12 mg/dL (ref 6–23)
CO2: 27 meq/L (ref 19–32)
Calcium: 9.6 mg/dL (ref 8.4–10.5)
Chloride: 103 meq/L (ref 96–112)
Creatinine, Ser: 1.06 mg/dL (ref 0.40–1.50)
GFR: 77.5 mL/min (ref >60.00)
Glucose, Bld: 100 mg/dL — ABNORMAL HIGH (ref 70–99)
Potassium: 4.7 meq/L (ref 3.5–5.1)
Sodium: 138 meq/L (ref 135–145)

## 2023-09-06 LAB — CBC WITH DIFFERENTIAL/PLATELET
Basophils Absolute: 0 10*3/uL (ref 0.0–0.1)
Basophils Relative: 0.3 % (ref 0.0–3.0)
Eosinophils Absolute: 0.1 10*3/uL (ref 0.0–0.7)
Eosinophils Relative: 1.1 % (ref 0.0–5.0)
HCT: 48.1 % (ref 39.0–52.0)
Hemoglobin: 16.1 g/dL (ref 13.0–17.0)
Lymphocytes Relative: 35.3 % (ref 12.0–46.0)
Lymphs Abs: 2.4 10*3/uL (ref 0.7–4.0)
MCHC: 33.5 g/dL (ref 30.0–36.0)
MCV: 91.2 fL (ref 78.0–100.0)
Monocytes Absolute: 0.9 10*3/uL (ref 0.1–1.0)
Monocytes Relative: 12.5 % — ABNORMAL HIGH (ref 3.0–12.0)
Neutro Abs: 3.5 10*3/uL (ref 1.4–7.7)
Neutrophils Relative %: 50.8 % (ref 43.0–77.0)
Platelets: 221 10*3/uL (ref 150.0–400.0)
RBC: 5.27 Mil/uL (ref 4.22–5.81)
RDW: 13.4 % (ref 11.5–15.5)
WBC: 6.8 10*3/uL (ref 4.0–10.5)

## 2023-09-06 LAB — LIPID PANEL
Cholesterol: 193 mg/dL (ref 0–200)
HDL: 51.1 mg/dL
LDL Cholesterol: 118 mg/dL — ABNORMAL HIGH (ref 0–99)
NonHDL: 142.21
Total CHOL/HDL Ratio: 4
Triglycerides: 123 mg/dL (ref 0.0–149.0)
VLDL: 24.6 mg/dL (ref 0.0–40.0)

## 2023-09-06 LAB — TSH: TSH: 2.73 u[IU]/mL (ref 0.35–5.50)

## 2023-09-06 LAB — HEMOGLOBIN A1C: Hgb A1c MFr Bld: 6.3 % (ref 4.6–6.5)

## 2023-09-06 LAB — PSA: PSA: 0.22 ng/mL (ref 0.10–4.00)

## 2023-09-06 MED ORDER — LOSARTAN POTASSIUM 50 MG PO TABS: 50.0000 mg | ORAL_TABLET | Freq: Every day | ORAL | 1 refills | Status: DC
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
## 2023-09-06 NOTE — Patient Instructions (Addendum)
Vaccines I recommend: Flu shot Shingrix (shingles) COVID booster   Check the  blood pressure regularly Blood pressure goal:  between 110/65 and  135/85. If it is consistently higher or lower, let me know     GO TO THE LAB : Get the blood work     Next visit with me in 3 months    Please schedule it at the front desk

## 2023-09-07 ENCOUNTER — Encounter: Payer: Self-pay | Admitting: Internal Medicine

## 2023-09-07 NOTE — Assessment & Plan Note (Signed)
Here for CPX - Td 08/04/2020 - Vaccines I recommend: Shingrix, flu and COVID boosters.  Declined all -Colon cancer screening: Colonoscopy 07/2017, + polyp, per GI letter repeat in 10 years -prostate cancer screening: No FH, no SX, check PSA.   -Labs: BMP FLP CBC A1c TSH PSA - Diet exercise: Over the last year, diet has changed, still needs to work on improve his diet

## 2023-09-07 NOTE — Assessment & Plan Note (Signed)
Here for CPX  Other issues addressed Hyperglycemia: Check A1c, the meaning of A1c discussed with the patient, he has prediabetes. HTN: For a while he stopped losartan, noted his BP to start going up so he went back on losartan.  Check labs, monitor BPs.  Hyperlipidemia: Reports that he took atorvastatin for "only few weeks" then self d/c it. Explained benefit of statins particularly in the context of some cholesterol accumulated in the coronary arteries per CT angio.  Main goal is to prevent a  MI, CAD.  We agreed to check a FLP, likely will recommend a  statin, he said he will consider it. Cardiovascular: Saw cardiology/2024 OSA: Good CPAP compliance RTC 3 months

## 2023-09-09 ENCOUNTER — Encounter: Payer: Self-pay | Admitting: Internal Medicine

## 2023-09-09 MED ORDER — ROSUVASTATIN CALCIUM 5 MG PO TABS: 5.0000 mg | ORAL_TABLET | Freq: Every day | ORAL | 0 refills | Status: DC

## 2023-09-09 NOTE — Addendum Note (Signed)
Addended byConrad Sweetwater D on: 09/09/2023 08:05 AM   Modules accepted: Orders

## 2023-12-05 ENCOUNTER — Ambulatory Visit: Payer: 59 | Admitting: Internal Medicine

## 2023-12-30 ENCOUNTER — Ambulatory Visit: Admitting: Internal Medicine

## 2024-01-16 ENCOUNTER — Ambulatory Visit: Admitting: Internal Medicine

## 2024-01-16 ENCOUNTER — Encounter: Payer: Self-pay | Admitting: Internal Medicine

## 2024-01-16 VITALS — BP 126/80 | HR 81 | Temp 97.9°F | Resp 16 | Ht 72.0 in | Wt 215.4 lb

## 2024-01-16 DIAGNOSIS — R739 Hyperglycemia, unspecified: Secondary | ICD-10-CM | POA: Diagnosis not present

## 2024-01-16 DIAGNOSIS — I10 Essential (primary) hypertension: Secondary | ICD-10-CM

## 2024-01-16 DIAGNOSIS — E785 Hyperlipidemia, unspecified: Secondary | ICD-10-CM | POA: Diagnosis not present

## 2024-01-16 LAB — BASIC METABOLIC PANEL WITH GFR
BUN: 16 mg/dL (ref 6–23)
CO2: 25 meq/L (ref 19–32)
Calcium: 9.5 mg/dL (ref 8.4–10.5)
Chloride: 105 meq/L (ref 96–112)
Creatinine, Ser: 1.02 mg/dL (ref 0.40–1.50)
GFR: 80.95 mL/min (ref >60.00)
Glucose, Bld: 109 mg/dL — ABNORMAL HIGH (ref 70–99)
Potassium: 3.8 meq/L (ref 3.5–5.1)
Sodium: 138 meq/L (ref 135–145)

## 2024-01-16 MED ORDER — EZETIMIBE 10 MG PO TABS: 10.0000 mg | ORAL_TABLET | Freq: Every day | ORAL | 3 refills | Status: AC
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
## 2024-01-16 NOTE — Patient Instructions (Signed)
 Start ZETIA 1 tablet a day   Check the  blood pressure regularly Blood pressure goal:  between 110/65 and  135/85. If it is consistently higher or lower, let me know     GO TO THE LAB :  Get the blood work   Your results will be posted on MyChart with my comments  Next office visit for a physical exam by 08/2024 Please make an appointment before you leave today

## 2024-01-17 DIAGNOSIS — R739 Hyperglycemia, unspecified: Secondary | ICD-10-CM | POA: Insufficient documentation

## 2024-01-17 NOTE — Assessment & Plan Note (Signed)
 HTN: Ambulatory BPs in the 120s, continue losartan .  Check BMP. Hyperglycemia: Last A1c 6.3.  Recheck on RTC, has healthy lifestyle. Dyslipidemia: Last LDL 118 (08/2023), I recommended Crestor  5 mg qd, took it for 3 weeks, developed leg cramps and headaches, self stopped it, s/e gone. He also is intolerant to atorvastatin .  Plan: Continue healthy lifestyle, trial with Zetia, explained this is a nonstatin medication and essentially free of side effects. Aspirin : Continue with aspirin  RTC 08/2024.

## 2024-01-18 ENCOUNTER — Ambulatory Visit: Payer: Self-pay | Admitting: Internal Medicine

## 2024-07-06 ENCOUNTER — Other Ambulatory Visit: Payer: Self-pay | Admitting: Internal Medicine

## 2024-09-11 ENCOUNTER — Encounter: Admitting: Internal Medicine

## 2024-11-27 ENCOUNTER — Encounter: Admitting: Internal Medicine
